# Patient Record
Sex: Female | Born: 1980 | Race: White | Hispanic: Yes | Marital: Single | Smoking: Former smoker
Health system: Southern US, Community
[De-identification: ages and names within clinical notes are randomized; demographics above are authoritative.]

## PROBLEM LIST (undated history)

## (undated) DIAGNOSIS — N210 Calculus in bladder: Secondary | ICD-10-CM

## (undated) DIAGNOSIS — E039 Hypothyroidism, unspecified: Secondary | ICD-10-CM

## (undated) DIAGNOSIS — D649 Anemia, unspecified: Secondary | ICD-10-CM

## (undated) DIAGNOSIS — O24419 Gestational diabetes mellitus in pregnancy, unspecified control: Secondary | ICD-10-CM

## (undated) HISTORY — DX: Gestational diabetes mellitus in pregnancy, unspecified control: O24.419

## (undated) HISTORY — PX: GALLBLADDER SURGERY: SHX652

---

## 2014-03-09 NOTE — L&D Delivery Note (Signed)
Obstetrical Delivery Note   Date of Delivery:   11/01/2014 at 1134 Primary OB:   [facility] Gestational Age/EDD: [redacted]w[redacted]d (Dated by 30 week ultrasound) Antepartum complications: late onset care, UTIs, noncompliant with care, suspected macrosomia  Delivered By:   Farrel Conners, CNM  Delivery Type:   spontaneous vaginal delivery  Procedure Details:   Rapid progress to complete and +1 after AROM of forebag. Anesthesia rebolused epidural just before she was completely dilated. Spontaneous vaginal delivery of vigorous female infant in OA with thick MSAF. Baby dried and placed on maternal abdomen. After delayed cord clamping, cord cut, and baby placed skin to skin. Placenta delivered intact, spontaneously,  followed by brisk bleeding. IV Pitcoin, bimanual and Cytotec 800 mcg PR resolved the heavy bleeding. Anesthesia:    epidural Intrapartum complications: Meconium stained amniotic fluid and variable decelerations in second stage GBS:    negative Laceration:    none Episiotomy:    none Placenta:    Via active 3rd stage. To pathology: no Estimated Blood Loss:  475 ml  Baby:    Liveborn female, Apgars 8/9, weight 8#    Delton Stelle, CNM

## 2014-08-16 ENCOUNTER — Other Ambulatory Visit: Payer: Self-pay | Admitting: Advanced Practice Midwife

## 2014-08-16 DIAGNOSIS — Z3689 Encounter for other specified antenatal screening: Secondary | ICD-10-CM

## 2014-08-17 LAB — OB RESULTS CONSOLE ABO/RH: RH Type: POSITIVE

## 2014-08-17 LAB — OB RESULTS CONSOLE RPR
RPR: NONREACTIVE
RPR: NONREACTIVE
RPR: NONREACTIVE

## 2014-08-17 LAB — OB RESULTS CONSOLE HGB/HCT, BLOOD: HEMOGLOBIN: 10.6 g/dL

## 2014-08-17 LAB — OB RESULTS CONSOLE HIV ANTIBODY (ROUTINE TESTING)
HIV: NONREACTIVE
HIV: NONREACTIVE
HIV: NONREACTIVE

## 2014-08-17 LAB — OB RESULTS CONSOLE PLATELET COUNT: PLATELETS: 167 10*3/uL

## 2014-08-17 LAB — OB RESULTS CONSOLE VARICELLA ZOSTER ANTIBODY, IGG: Varicella: IMMUNE

## 2014-08-17 LAB — OB RESULTS CONSOLE RUBELLA ANTIBODY, IGM: RUBELLA: IMMUNE

## 2014-08-17 LAB — OB RESULTS CONSOLE HEPATITIS B SURFACE ANTIGEN: HEP B S AG: NEGATIVE

## 2014-08-20 ENCOUNTER — Ambulatory Visit
Admission: RE | Admit: 2014-08-20 | Discharge: 2014-08-20 | Disposition: A | Discharge status: Home / Self Care | Payer: Medicaid Other | Source: Ambulatory Visit | Attending: Advanced Practice Midwife | Admitting: Advanced Practice Midwife

## 2014-08-20 DIAGNOSIS — Z3A30 30 weeks gestation of pregnancy: Secondary | ICD-10-CM | POA: Diagnosis not present

## 2014-08-20 DIAGNOSIS — Z36 Encounter for antenatal screening of mother: Secondary | ICD-10-CM | POA: Diagnosis not present

## 2014-08-20 DIAGNOSIS — Z3689 Encounter for other specified antenatal screening: Secondary | ICD-10-CM

## 2014-10-16 LAB — OB RESULTS CONSOLE GC/CHLAMYDIA
Chlamydia: NEGATIVE
GC PROBE AMP, GENITAL: NEGATIVE

## 2014-10-17 LAB — OB RESULTS CONSOLE GBS: STREP GROUP B AG: NEGATIVE

## 2014-10-30 ENCOUNTER — Inpatient Hospital Stay
Admission: EM | Admit: 2014-10-30 | Discharge: 2014-11-02 | DRG: 775 | Disposition: A | Discharge status: Home / Self Care | Payer: Medicaid Other | Attending: Obstetrics & Gynecology | Admitting: Obstetrics & Gynecology

## 2014-10-30 DIAGNOSIS — Z3A41 41 weeks gestation of pregnancy: Secondary | ICD-10-CM | POA: Diagnosis present

## 2014-10-30 DIAGNOSIS — O480 Post-term pregnancy: Secondary | ICD-10-CM | POA: Diagnosis present

## 2014-10-30 DIAGNOSIS — Z87440 Personal history of urinary (tract) infections: Secondary | ICD-10-CM | POA: Diagnosis not present

## 2014-10-30 LAB — CBC
HEMATOCRIT: 35.1 % (ref 35.0–47.0)
HEMOGLOBIN: 11.9 g/dL — AB (ref 12.0–16.0)
MCH: 30.9 pg (ref 26.0–34.0)
MCHC: 34 g/dL (ref 32.0–36.0)
MCV: 91 fL (ref 80.0–100.0)
Platelets: 196 10*3/uL (ref 150–440)
RBC: 3.85 MIL/uL (ref 3.80–5.20)
RDW: 13.7 % (ref 11.5–14.5)
WBC: 13.7 10*3/uL — ABNORMAL HIGH (ref 3.6–11.0)

## 2014-10-30 MED ORDER — FLEET ENEMA 7-19 GM/118ML RE ENEM: 1.0000 | ENEMA | RECTAL | Status: DC | PRN

## 2014-10-30 MED ORDER — ZOLPIDEM TARTRATE 5 MG PO TABS: 5.0000 mg | ORAL_TABLET | Freq: Every evening | ORAL | Status: DC | PRN

## 2014-10-30 MED ORDER — ZOLPIDEM TARTRATE 5 MG PO TABS
ORAL_TABLET | ORAL | Status: AC
  Administered 2014-10-31: 5 mg via ORAL
  Filled 2014-10-30: qty 1

## 2014-10-30 MED ORDER — OXYTOCIN BOLUS FROM INFUSION
500.0000 mL | INTRAVENOUS | Status: DC
  Administered 2014-11-01: 500 mL via INTRAVENOUS

## 2014-10-30 MED ORDER — OXYTOCIN 40 UNITS IN LACTATED RINGERS INFUSION - SIMPLE MED
1.0000 m[IU]/min | INTRAVENOUS | Status: DC
  Administered 2014-10-31: 1 m[IU]/min via INTRAVENOUS
  Administered 2014-10-31: 3 m[IU]/min via INTRAVENOUS
  Filled 2014-10-30: qty 1000

## 2014-10-30 MED ORDER — LACTATED RINGERS IV SOLN
500.0000 mL | INTRAVENOUS | Status: DC | PRN
  Administered 2014-10-31: 1000 mL via INTRAVENOUS

## 2014-10-30 MED ORDER — ACETAMINOPHEN 325 MG PO TABS: 650.0000 mg | ORAL_TABLET | ORAL | Status: DC | PRN

## 2014-10-30 MED ORDER — OXYCODONE-ACETAMINOPHEN 5-325 MG PO TABS: 2.0000 | ORAL_TABLET | ORAL | Status: DC | PRN

## 2014-10-30 MED ORDER — ONDANSETRON HCL 4 MG/2ML IJ SOLN
4.0000 mg | Freq: Four times a day (QID) | INTRAMUSCULAR | Status: DC | PRN
  Administered 2014-10-31 (×2): 4 mg via INTRAVENOUS
  Filled 2014-10-30 (×2): qty 2

## 2014-10-30 MED ORDER — TERBUTALINE SULFATE 1 MG/ML IJ SOLN: 0.2500 mg | Freq: Once | INTRAMUSCULAR | Status: DC | PRN

## 2014-10-30 MED ORDER — OXYCODONE-ACETAMINOPHEN 5-325 MG PO TABS: 1.0000 | ORAL_TABLET | ORAL | Status: DC | PRN

## 2014-10-30 MED ORDER — DINOPROSTONE 10 MG VA INST
10.0000 mg | VAGINAL_INSERT | Freq: Once | VAGINAL | Status: AC
  Administered 2014-10-31: 10 mg via VAGINAL
  Filled 2014-10-30: qty 1

## 2014-10-30 MED ORDER — ZOLPIDEM TARTRATE 5 MG PO TABS
5.0000 mg | ORAL_TABLET | Freq: Every evening | ORAL | Status: DC | PRN
  Administered 2014-10-31: 5 mg via ORAL

## 2014-10-30 MED ORDER — OXYTOCIN 40 UNITS IN LACTATED RINGERS INFUSION - SIMPLE MED
62.5000 mL/h | INTRAVENOUS | Status: DC
  Administered 2014-11-01: 62.5 mL/h via INTRAVENOUS
  Filled 2014-10-30: qty 1000

## 2014-10-30 MED ORDER — CITRIC ACID-SODIUM CITRATE 334-500 MG/5ML PO SOLN: 30.0000 mL | ORAL | Status: DC | PRN

## 2014-10-30 MED ORDER — FENTANYL CITRATE (PF) 100 MCG/2ML IJ SOLN: 50.0000 ug | INTRAMUSCULAR | Status: DC | PRN

## 2014-10-30 MED ORDER — LIDOCAINE HCL (PF) 1 % IJ SOLN
30.0000 mL | INTRAMUSCULAR | Status: DC | PRN
  Filled 2014-10-30: qty 30

## 2014-10-30 MED ORDER — LACTATED RINGERS IV SOLN
INTRAVENOUS | Status: DC
  Administered 2014-10-30 – 2014-10-31 (×2): via INTRAVENOUS

## 2014-10-30 NOTE — H&P (Signed)
Obstetrics Admission History & Physical  10/30/2014 - 11:15 PM Primary OBGYN: [facility]  Chief Complaint: IOL for post dates  History of Present Illness  34 y.o. [id number] @ 40/6 (dated by 30wk u/s), with the above CC. Pregnancy complicated by: h/o UTI and currently on ppx, h/o anemia, h/o macrosomic pregnancies.  [name] states that no labor s/s or decreased FM  Review of Systems:  her 12 point review of systems is negative or as noted in the History of Present Illness.  PMHx: History reviewed. No pertinent past medical history. PSHx: History reviewed. No pertinent past surgical history. Medications:  Prescriptions prior to admission  Medication Sig Dispense Refill Last Dose  . ferrous fumarate (HEMOCYTE - 106 MG FE) 325 (106 FE) MG TABS tablet Take 1 tablet by mouth.     . nitrofurantoin (MACRODANTIN) 100 MG capsule Take 100 mg by mouth 2 (two) times daily.     . Prenatal Vit-Fe Fumarate-FA (PRENATAL MULTIVITAMIN) TABS tablet Take 1 tablet by mouth daily at 12 noon.        Allergies: has No Known Allergies. OBGYNHx: Largest child was 9lbs 7oz. As above FHx: No family history on file. Soc Hx:  Social History   Social History  . Marital Status: Single    Spouse Name: N/A  . Number of Children: N/A  . Years of Education: N/A   Occupational History  . Not on file.   Social History Main Topics  . Smoking status: Not on file  . Smokeless tobacco: Not on file  . Alcohol Use: Not on file  . Drug Use: Not on file  . Sexual Activity: Not on file   Other Topics Concern  . Not on file   Social History Narrative    Objective  VS pending  EFM: 140 baseline, +accels, one late decel seen, mod variability Toco: irregular UCs  General: Well nourished, well developed female in no acute distress.  Skin:  Warm and dry.  Cardiovascular: Regular rate and rhythm. Respiratory:  Clear to auscultation bilateral. Normal respiratory effort Abdomen: soft, obese, nttp,  gravid Neuro/Psych:  Normal mood and affect.   SVE: 1-2/50/-3/soft/midpositon; doesn't tolerate exams well Leopolds/EFW: 8/22 4331gm, >90%, AC 1wk ahead, normal AFI, cephalic  Labs  pending  Radiology Bedside u/s cephalic  Perinatal info  A pos/ Rubella immune / VaricellaImmune/RPR pending/HIV neg/HepB Surf Ag neg/TDaP declined /pap unknown/desires BTL  Assessment & Plan  Pt doing well here for IOL *IUP: follow up vital signs. category I tracing currently *IOL: will place cervidil, given poor tolerance of exams *Macrosomic fetus: keep close eye on labor course as poor dilation and/or fetal descent could be sign of CPD. Extra help in room for delivery, in case shoulder dystocia. Consideration of PP miso as has two risk factors for PPH: potentially large fetus and future grand multip -s/p negative 28wk 1hr GTT *GBS: neg *Analgesia: no needs currently *Contraception: follow up BTL papers and try to obtain them from [facility] as patient desires ppBTL. Obese abdomen and will have to be up to provider if pt is acceptable for ppBTL *h/o UTI: follow up UCx on admission  Cornelia Copa. MD [facility] Pager [phone]

## 2014-10-31 ENCOUNTER — Inpatient Hospital Stay: Payer: Medicaid Other | Admitting: Anesthesiology

## 2014-10-31 LAB — TYPE AND SCREEN
ABO/RH(D): A POS
Antibody Screen: NEGATIVE

## 2014-10-31 LAB — CBC
HEMATOCRIT: 36.4 % (ref 35.0–47.0)
HEMOGLOBIN: 12.3 g/dL (ref 12.0–16.0)
MCH: 30.7 pg (ref 26.0–34.0)
MCHC: 33.8 g/dL (ref 32.0–36.0)
MCV: 90.9 fL (ref 80.0–100.0)
Platelets: 193 10*3/uL (ref 150–440)
RBC: 4.01 MIL/uL (ref 3.80–5.20)
RDW: 13.9 % (ref 11.5–14.5)
WBC: 14 10*3/uL — AB (ref 3.6–11.0)

## 2014-10-31 LAB — ABO/RH: ABO/RH(D): A POS

## 2014-10-31 MED ORDER — FENTANYL 2.5 MCG/ML W/ROPIVACAINE 0.2% IN NS 100 ML EPIDURAL INFUSION (ARMC-ANES)
EPIDURAL | Status: AC
  Administered 2014-10-31: 10 mL/h via EPIDURAL
  Filled 2014-10-31: qty 100

## 2014-10-31 MED ORDER — TERBUTALINE SULFATE 1 MG/ML IJ SOLN
0.2500 mg | Freq: Once | INTRAMUSCULAR | Status: DC | PRN
Start: 2014-10-31 — End: 2014-11-01

## 2014-10-31 MED ORDER — BUTORPHANOL TARTRATE 1 MG/ML IJ SOLN
1.0000 mg | INTRAMUSCULAR | Status: DC | PRN
  Administered 2014-10-31 (×5): 1 mg via INTRAVENOUS
  Filled 2014-10-31 (×4): qty 1

## 2014-10-31 MED ORDER — OXYTOCIN 40 UNITS IN LACTATED RINGERS INFUSION - SIMPLE MED: 1.0000 m[IU]/min | INTRAVENOUS | Status: DC

## 2014-10-31 MED ORDER — BUPIVACAINE HCL (PF) 0.25 % IJ SOLN
INTRAMUSCULAR | Status: DC | PRN
  Administered 2014-10-31: 5 mL via PERINEURAL
  Administered 2014-11-01: 5 mL

## 2014-10-31 MED ORDER — LIDOCAINE HCL (PF) 1 % IJ SOLN
INTRAMUSCULAR | Status: DC | PRN
  Administered 2014-10-31: 5 mL via EPIDURAL

## 2014-10-31 MED ORDER — BUTORPHANOL TARTRATE 1 MG/ML IJ SOLN
INTRAMUSCULAR | Status: AC
  Administered 2014-10-31: 1 mg via INTRAVENOUS
  Filled 2014-10-31: qty 1

## 2014-10-31 NOTE — Anesthesia Procedure Notes (Signed)
Epidural Patient location during procedure: OB Start time: 10/31/2014 10:35 PM End time: 10/31/2014 10:59 PM  Staffing Anesthesiologist: Elijio Miles F  Preanesthetic Checklist Completed: patient identified, site marked, surgical consent, pre-op evaluation, timeout performed, IV checked, risks and benefits discussed, monitors and equipment checked and at surgeon's request  Epidural Patient position: sitting Prep: Betadine Patient monitoring: heart rate and blood pressure Approach: midline Location: L3-L4 Injection technique: LOR air and LOR saline  Needle:  Needle type: Tuohy  Needle gauge: 18 G Needle length: 9 cm Needle insertion depth: 5 cm Catheter type: closed end flexible Catheter size: 20 Guage Catheter at skin depth: 10 cm Test dose: negative and 2% lidocaine with Epi 1:200 K  Assessment Sensory level: T8  Additional Notes Reason for block:at surgeon's request

## 2014-10-31 NOTE — Anesthesia Preprocedure Evaluation (Addendum)
Anesthesia Evaluation  Patient identified by MRN, date of birth, ID band Patient awake    Reviewed: Allergy & Precautions, NPO status , Patient's Chart, lab work & pertinent test results  Airway Mallampati: I       Dental no notable dental hx.    Pulmonary neg pulmonary ROS,    Pulmonary exam normal       Cardiovascular negative cardio ROS Normal cardiovascular exam    Neuro/Psych negative neurological ROS  negative psych ROS   GI/Hepatic negative GI ROS, Neg liver ROS,   Endo/Other  negative endocrine ROS  Renal/GU negative Renal ROS     Musculoskeletal   Abdominal Normal abdominal exam  (+)   Peds negative pediatric ROS (+)  Hematology negative hematology ROS (+)   Anesthesia Other Findings   Reproductive/Obstetrics                             Anesthesia Physical Anesthesia Plan  ASA: II  Anesthesia Plan: Epidural   Post-op Pain Management:    Induction:   Airway Management Planned: Natural Airway  Additional Equipment:   Intra-op Plan:   Post-operative Plan:   Informed Consent: I have reviewed the patients History and Physical, chart, labs and discussed the procedure including the risks, benefits and alternatives for the proposed anesthesia with the patient or authorized representative who has indicated his/her understanding and acceptance.     Plan Discussed with:   Anesthesia Plan Comments:         Anesthesia Quick Evaluation

## 2014-11-01 ENCOUNTER — Encounter: Payer: Self-pay | Admitting: *Deleted

## 2014-11-01 LAB — URINE CULTURE

## 2014-11-01 LAB — RPR: RPR: NONREACTIVE

## 2014-11-01 MED ORDER — LIDOCAINE HCL (PF) 1 % IJ SOLN
INTRAMUSCULAR | Status: AC
  Filled 2014-11-01: qty 30

## 2014-11-01 MED ORDER — WITCH HAZEL-GLYCERIN EX PADS: 1.0000 "application " | MEDICATED_PAD | CUTANEOUS | Status: DC | PRN

## 2014-11-01 MED ORDER — OXYTOCIN 40 UNITS IN LACTATED RINGERS INFUSION - SIMPLE MED: 62.5000 mL/h | INTRAVENOUS | Status: DC | PRN

## 2014-11-01 MED ORDER — ONDANSETRON HCL 4 MG PO TABS
4.0000 mg | ORAL_TABLET | ORAL | Status: DC | PRN
  Administered 2014-11-02: 4 mg via ORAL
  Filled 2014-11-01: qty 1

## 2014-11-01 MED ORDER — OXYCODONE-ACETAMINOPHEN 5-325 MG PO TABS
1.0000 | ORAL_TABLET | ORAL | Status: DC | PRN
  Administered 2014-11-01: 1 via ORAL
  Filled 2014-11-01: qty 1

## 2014-11-01 MED ORDER — NITROFURANTOIN MONOHYD MACRO 100 MG PO CAPS
100.0000 mg | ORAL_CAPSULE | Freq: Two times a day (BID) | ORAL | Status: DC
  Administered 2014-11-01: 100 mg via ORAL
  Filled 2014-11-01: qty 1

## 2014-11-01 MED ORDER — MISOPROSTOL 200 MCG PO TABS
800.0000 ug | ORAL_TABLET | Freq: Once | ORAL | Status: AC | PRN
  Administered 2014-11-01: 800 ug via RECTAL

## 2014-11-01 MED ORDER — OXYCODONE-ACETAMINOPHEN 5-325 MG PO TABS
2.0000 | ORAL_TABLET | ORAL | Status: DC | PRN
  Administered 2014-11-01 – 2014-11-02 (×4): 2 via ORAL
  Filled 2014-11-01 (×4): qty 2

## 2014-11-01 MED ORDER — LACTATED RINGERS IV SOLN
INTRAVENOUS | Status: DC
  Administered 2014-11-01: 22:00:00 via INTRAVENOUS

## 2014-11-01 MED ORDER — BENZOCAINE-MENTHOL 20-0.5 % EX AERO: 1.0000 "application " | INHALATION_SPRAY | CUTANEOUS | Status: DC | PRN

## 2014-11-01 MED ORDER — FENTANYL 2.5 MCG/ML W/ROPIVACAINE 0.2% IN NS 100 ML EPIDURAL INFUSION (ARMC-ANES)
EPIDURAL | Status: AC
  Administered 2014-11-01: 10 mL/h
  Filled 2014-11-01: qty 100

## 2014-11-01 MED ORDER — ONDANSETRON HCL 4 MG/2ML IJ SOLN: 4.0000 mg | INTRAMUSCULAR | Status: DC | PRN

## 2014-11-01 MED ORDER — IBUPROFEN 600 MG PO TABS
600.0000 mg | ORAL_TABLET | Freq: Four times a day (QID) | ORAL | Status: DC
  Administered 2014-11-01 – 2014-11-02 (×5): 600 mg via ORAL
  Filled 2014-11-01 (×5): qty 1

## 2014-11-01 MED ORDER — MISOPROSTOL 200 MCG PO TABS
ORAL_TABLET | ORAL | Status: AC
  Administered 2014-11-01: 800 ug via RECTAL
  Filled 2014-11-01: qty 4

## 2014-11-01 MED ORDER — STERILE WATER FOR INJECTION IJ SOLN
INTRAMUSCULAR | Status: AC
  Filled 2014-11-01: qty 10

## 2014-11-01 MED ORDER — LANOLIN HYDROUS EX OINT: TOPICAL_OINTMENT | CUTANEOUS | Status: DC | PRN

## 2014-11-01 MED ORDER — FERROUS SULFATE 325 (65 FE) MG PO TABS
325.0000 mg | ORAL_TABLET | Freq: Every day | ORAL | Status: DC
  Administered 2014-11-02: 325 mg via ORAL
  Filled 2014-11-01: qty 1

## 2014-11-01 MED ORDER — NITROFURANTOIN MACROCRYSTAL 100 MG PO CAPS
100.0000 mg | ORAL_CAPSULE | Freq: Two times a day (BID) | ORAL | Status: DC
  Filled 2014-11-01 (×3): qty 1

## 2014-11-01 MED ORDER — PRENATAL MULTIVITAMIN CH
1.0000 | ORAL_TABLET | Freq: Every day | ORAL | Status: DC
  Administered 2014-11-02: 1 via ORAL
  Filled 2014-11-01: qty 1

## 2014-11-01 MED ORDER — DIBUCAINE 1 % RE OINT: 1.0000 "application " | TOPICAL_OINTMENT | RECTAL | Status: DC | PRN

## 2014-11-01 MED ORDER — DOCUSATE SODIUM 100 MG PO CAPS
100.0000 mg | ORAL_CAPSULE | Freq: Two times a day (BID) | ORAL | Status: DC
  Administered 2014-11-01 – 2014-11-02 (×2): 100 mg via ORAL
  Filled 2014-11-01 (×2): qty 1

## 2014-11-01 MED ORDER — SIMETHICONE 80 MG PO CHEW: 80.0000 mg | CHEWABLE_TABLET | ORAL | Status: DC | PRN

## 2014-11-01 NOTE — Discharge Summary (Signed)
Physician Obstetric Discharge Summary  Patient ID: Cynthia Mckay MRN: [id number] DOB/AGE: [birth year] 34 y.o.   Date of Admission: 10/30/2014 Date of delivery:11/01/2014 Date of Discharge: 11/02/14  Admitting Diagnosis: Induction of labor at [redacted]w[redacted]d  Secondary Diagnosis: Suspected macrosomia.   Mode of Delivery: normal spontaneous vaginal delivery 10/31/2104       Discharge Diagnosis: Term pregnancy-delivered, Meconium stained amniotic fluid   Intrapartum Procedures: Cervidil, Pitocin induction, Amniotomy of forebag, Intrauterine pressure catheter, Fetal scalp electrode, epidural anesthesia   Post partum procedures: none  Complications: none   Brief Hospital Course  Cynthia Mckay is a [id number] who had a SVD on 11/01/2014;  for further details of this delivery, please refer to the delivery note.  Patient had an uncomplicated postpartum course.  By time of discharge on PPD#1, her pain was controlled on oral pain medications; she had appropriate lochia and was ambulating, voiding without difficulty and tolerating regular diet.  She was deemed stable for discharge to home.    Labs: CBC Latest Ref Rng 11/02/2014 10/31/2014 10/30/2014  WBC 3.6 - 11.0 K/uL 11.9(H) 14.0(H) 13.7(H)  Hemoglobin 12.0 - 16.0 g/dL 10.7(L) 12.3 11.9(L)  Hematocrit 35.0 - 47.0 % 31.4(L) 36.4 35.1  Platelets 150 - 440 K/uL 179 193 196   A POS  Physical exam:  Blood pressure 131/74, pulse 93, temperature 97.9 F (36.6 C), temperature source Oral, resp. rate 20, height  (1.676 m), weight 114.306 kg (252 lb), last menstrual period 03/07/2014, SpO2 99 %, unknown if currently breastfeeding. General: alert and no distress Lochia: appropriate Abdomen: soft, NT Uterine Fundus: firm Extremities: No evidence of DVT seen on physical exam. No lower extremity edema.  Discharge Instructions: Per After Visit Summary. Activity: Advance as tolerated. Pelvic rest for 6 weeks.  Also refer to Discharge Instructions Diet:  Regular Medications:   Medication List    STOP taking these medications        nitrofurantoin 100 MG capsule  Commonly known as:  MACRODANTIN      TAKE these medications        ferrous fumarate 325 (106 FE) MG Tabs tablet  Commonly known as:  HEMOCYTE - 106 mg FE  Take 1 tablet by mouth.     ibuprofen 600 MG tablet  Commonly known as:  ADVIL,MOTRIN  Take 1 tablet (600 mg total) by mouth every 6 (six) hours.     prenatal multivitamin Tabs tablet  Take 1 tablet by mouth daily at 12 noon.       Outpatient follow up:  Follow-up Information    Follow up with Ward, Elenora Fender, MD In 4 weeks.   Specialty:  Obstetrics and Gynecology   Why:  For Post Partum Check and to schedule/plan Lap BTL   Contact information:   [address] [place] [address] [phone]       Follow up with [facility] In 6 weeks.   Why:  For Post Partum Check   Contact information:   [address] [place] [address] [phone]      Postpartum contraception: bilateral tubal ligation- plans after 4-6 weeks (did not want while in hospital now)  Discharged Condition: good  Discharged to: home   Newborn Data: Disposition:home with mother  Apgars: APGAR (1 MIN): 8   APGAR (5 MINS): 9   Baby Feeding: breast and bottle/ 8#/ female-Cynthia  Letitia Libra, MD 11/02/2014 7:57 AM

## 2014-11-01 NOTE — Anesthesia Postprocedure Evaluation (Deleted)
  Anesthesia Post-op Note  Patient: Cynthia Mckay  Procedure(s) Performed: CLE Anesthesia type:Epidural  Patient location: LDR 4  Post pain: Pain level controlled  Post assessment: Post-op Vital signs reviewed, Patient's Cardiovascular Status Stable, Respiratory Function Stable, Patent Airway and No signs of Nausea or vomiting  Post vital signs: Reviewed and stable  Last Vitals:  Filed Vitals:   11/01/14 0759  BP:   Pulse:   Temp:   Resp: 18    Level of consciousness: awake, alert  and patient cooperative  Complications: No apparent anesthesia complications

## 2014-11-01 NOTE — Progress Notes (Signed)
L&D Note  11/01/2014 - 8:31 AM  34 y.o. [id number] [redacted]w[redacted]d by a 30wk5d ultrasound (EDC 10/24/2014)  Pregnancy complicated by late entry to care, UTIs, noncompliance with prenatal care, third trimester ultrasound dating pregnancy and suspected macrosomia.  Patient Active Problem List   Diagnosis Date Noted  . Labor and delivery, indication for care 10/30/2014  . Macrosomia 10/30/2014    [name] was admitted for IOL for suspected macrosomia and postdates (41 weeks).   Subjective:  Epidural working well. SROM at 0030 this AM.  Objective:   Filed Vitals:   11/01/14 0432 11/01/14 0539 11/01/14 0639 11/01/14 0759  BP: 115/84 133/65 121/68   Pulse: 104 98 88   Temp: 98 F (36.7 C)     TempSrc: Oral     Resp: 16   18  Height:      Weight:      SpO2:        Current Vital Signs 24h Vital Sign Ranges  T 98 F (36.7 C) Temp  Avg: 98.1 F (36.7 C)  Min: 97.8 F (36.6 C)  Max: 98.4 F (36.9 C)  BP 121/68 mmHg BP  Min: 94/51  Max: 142/72  HR 88 Pulse  Avg: 92.4  Min: 84  Max: 104  RR 18 Resp  Avg: 16.6  Min: 16  Max: 18  SaO2 99 % Not Delivered SpO2  Avg: 99.5 %  Min: 99 %  Max: 100 %       24 Hour I/O Current Shift I/O  Time Ins Outs 08/24 0701 - 08/25 0700 In: -  Out: 575 [Urine:575]     FHR:CAt 1 with baseline 125 and beautiful accelerations to 160s Toco: difficulty picking up with external monitor. AROM forebag for moderate MSAF. IUPC inserted. Contractions q2 min apart on 20 miu/min Pitocin. Pitocin decreased to 10 miu/min YNW:GNFAOZH comfortable SVE:5/75%/-2 and anterior  Labs:   Recent Labs Lab 10/30/14 2139 10/31/14 2107  WBC 13.7* 14.0*  HGB 11.9* 12.3  HCT 35.1 36.4  PLT 196 193   Medications Current Facility-Administered Medications  Medication Dose Route Frequency Provider Last Rate Last Dose  . acetaminophen (TYLENOL) tablet 650 mg  650 mg Oral Q4H PRN [place] Bing, MD      . butorphanol (STADOL) injection 1 mg  1 mg Intravenous Q1H PRN  [place] Bing, MD   1 mg at 10/31/14 2146  . citric acid-sodium citrate (ORACIT) solution 30 mL  30 mL Oral Q2H PRN [place] Bing, MD      . fentaNYL (SUBLIMAZE) injection 50 mcg  50 mcg Intravenous Q1H PRN [place] Bing, MD      . lactated ringers infusion 500-1,000 mL  500-1,000 mL Intravenous PRN [place] Bing, MD 1,000 mL/hr at 10/31/14 2055 1,000 mL at 10/31/14 2055  . lactated ringers infusion   Intravenous Continuous [place] Bing, MD 125 mL/hr at 10/31/14 1257    . lidocaine (PF) (XYLOCAINE) 1 % injection 30 mL  30 mL Subcutaneous PRN [place] Bing, MD      . lidocaine (PF) (XYLOCAINE) 1 % injection           . misoprostol (CYTOTEC) 200 MCG tablet           . ondansetron (ZOFRAN) injection 4 mg  4 mg Intravenous Q6H PRN [place] Bing, MD   4 mg at 10/31/14 1940  . oxytocin (PITOCIN) IV BOLUS FROM BAG  500 mL Intravenous Continuous  Bing, MD      . oxytocin (PITOCIN) IV infusion 40  units in LR 1000 mL  62.5 mL/hr Intravenous Continuous [place] Bing, MD      . oxytocin (PITOCIN) IV infusion 40 units in LR 1000 mL  1-40 milli-units/min Intravenous Titrated [place] Bing, MD 15 mL/hr at 11/01/14 0808 10 milli-units/min at 11/01/14 0808  . terbutaline (BRETHINE) injection 0.25 mg  0.25 mg Subcutaneous Once PRN [place] Bing, MD      . terbutaline (BRETHINE) injection 0.25 mg  0.25 mg Subcutaneous Once PRN Elenora Fender Ward, MD      . zolpidem (AMBIEN) tablet 5 mg  5 mg Oral QHS PRN Faith Bing, MD   5 mg at 10/31/14 0001   Facility-Administered Medications Ordered in Other Encounters  Medication Dose Route Frequency Provider Last Rate Last Dose  . bupivacaine (PF) (MARCAINE) 0.25 % injection    Anesthesia Intra-op Gijsbertus Georgana Curio, MD   5 mL at 10/31/14 2246  . lidocaine (PF) (XYLOCAINE) 1 % injection    Anesthesia Intra-op Gijsbertus Georgana Curio, MD   5 mL at 10/31/14 2246    Assessment & Plan:   A/P: IUP at 41.1 weeks with suspected macrosomia  undergoing IOL with Pitocin. Has been 5 cm since about 0530.   IUPC inserted-will titrate Pitocin to get 200 mvus  Monitor progress closely now that forebag ruptured  FWB: MSAF but with Cat 1 strip, will have neonatal team at delivery for deep suctioning if needed  Good pain relief with epidural  Cath urine culture due to hx of UTIs this pregnancy  Farrel Conners, CNM     [employer] Pager [phone]

## 2014-11-02 ENCOUNTER — Encounter
Admission: EM | Disposition: A | Discharge status: Home / Self Care | Payer: Self-pay | Source: Home / Self Care | Attending: Obstetrics & Gynecology

## 2014-11-02 LAB — CBC
HCT: 31.4 % — ABNORMAL LOW (ref 35.0–47.0)
Hemoglobin: 10.7 g/dL — ABNORMAL LOW (ref 12.0–16.0)
MCH: 31.1 pg (ref 26.0–34.0)
MCHC: 34.1 g/dL (ref 32.0–36.0)
MCV: 91.3 fL (ref 80.0–100.0)
PLATELETS: 179 10*3/uL (ref 150–440)
RBC: 3.44 MIL/uL — ABNORMAL LOW (ref 3.80–5.20)
RDW: 13.7 % (ref 11.5–14.5)
WBC: 11.9 10*3/uL — AB (ref 3.6–11.0)

## 2014-11-02 SURGERY — LIGATION, FALLOPIAN TUBE, POSTPARTUM
Anesthesia: General

## 2014-11-02 MED ORDER — IBUPROFEN 600 MG PO TABS: 600.0000 mg | ORAL_TABLET | Freq: Four times a day (QID) | ORAL | Status: DC

## 2014-11-02 NOTE — Discharge Instructions (Signed)
Postpartum Care After Vaginal Delivery °After you deliver your newborn (postpartum period), the usual stay in the hospital is 24-72 hours. If there were problems with your labor or delivery, or if you have other medical problems, you might be in the hospital longer.  °While you are in the hospital, you will receive help and instructions on how to care for yourself and your newborn during the postpartum period.  °While you are in the hospital: °· Be sure to tell your nurses if you have pain or discomfort, as well as where you feel the pain and what makes the pain worse. °· If you had an incision made near your vagina (episiotomy) or if you had some tearing during delivery, the nurses may put ice packs on your episiotomy or tear. The ice packs may help to reduce the pain and swelling. °· If you are breastfeeding, you may feel uncomfortable contractions of your uterus for a couple of weeks. This is normal. The contractions help your uterus get back to normal size. °· It is normal to have some bleeding after delivery. °¨ For the first 1-3 days after delivery, the flow is red and the amount may be similar to a period. °¨ It is common for the flow to start and stop. °¨ In the first few days, you may pass some small clots. Let your nurses know if you begin to pass large clots or your flow increases. °¨ Do not  flush blood clots down the toilet before having the nurse look at them. °¨ During the next 3-10 days after delivery, your flow should become more watery and pink or brown-tinged in color. °¨ Ten to fourteen days after delivery, your flow should be a small amount of yellowish-white discharge. °¨ The amount of your flow will decrease over the first few weeks after delivery. Your flow may stop in 6-8 weeks. Most women have had their flow stop by 12 weeks after delivery. °· You should change your sanitary pads frequently. °· Wash your hands thoroughly with soap and water for at least 20 seconds after changing pads, using  the toilet, or before holding or feeding your newborn. °· You should feel like you need to empty your bladder within the first 6-8 hours after delivery. °· In case you become weak, lightheaded, or faint, call your nurse before you get out of bed for the first time and before you take a shower for the first time. °· Within the first few days after delivery, your breasts may begin to feel tender and full. This is called engorgement. Breast tenderness usually goes away within 48-72 hours after engorgement occurs. You may also notice milk leaking from your breasts. If you are not breastfeeding, do not stimulate your breasts. Breast stimulation can make your breasts produce more milk. °· Spending as much time as possible with your newborn is very important. During this time, you and your newborn can feel close and get to know each other. Having your newborn stay in your room (rooming in) will help to strengthen the bond with your newborn.  It will give you time to get to know your newborn and become comfortable caring for your newborn. °· Your hormones change after delivery. Sometimes the hormone changes can temporarily cause you to feel sad or tearful. These feelings should not last more than a few days. If these feelings last longer than that, you should talk to your caregiver. °· If desired, talk to your caregiver about methods of family planning or contraception. °·   Talk to your caregiver about immunizations. Your caregiver may want you to have the following immunizations before leaving the hospital: °¨ Tetanus, diphtheria, and pertussis (Tdap) or tetanus and diphtheria (Td) immunization. It is very important that you and your family (including grandparents) or others caring for your newborn are up-to-date with the Tdap or Td immunizations. The Tdap or Td immunization can help protect your newborn from getting ill. °¨ Rubella immunization. °¨ Varicella (chickenpox) immunization. °¨ Influenza immunization. You should  receive this annual immunization if you did not receive the immunization during your pregnancy. °Document Released: 12/21/2006 Document Revised: 11/18/2011 Document Reviewed: 10/21/2011 °Exit[employer]® Patient Information ©2015 [employer]. This information is not intended to replace advice given to you by your health care provider. Make sure you discuss any questions you have with your health care provider. ° °Please call your doctor or return to the ER if you experience any chest pains, shortness of breath, fever greater than 101, any heavy bleeding or large clots, and foul smelling vaginal discharge, any worsening abdominal pain & cramping that is not controlled by pain medication, or any signs of post partum depression.  No tampons, enemas, douches, or sexual intercourse for 6 weeks.  Also avoid tub baths, hot tubs, or swimming for 6 weeks. ° °

## 2014-11-02 NOTE — Anesthesia Postprocedure Evaluation (Signed)
  Anesthesia Post-op Note  Patient: Cynthia Mckay  Procedure(s) Performed: CLE  Anesthesia type:Epidural  Patient location: 346  Post pain: Pain level controlled  Post assessment: Post-op Vital signs reviewed, Patient's Cardiovascular Status Stable, Respiratory Function Stable, Patent Airway and No signs of Nausea or vomiting  Post vital signs: Reviewed and stable  Last Vitals:  Filed Vitals:   11/02/14 0738  BP: 131/74  Pulse: 93  Temp: 36.6 C  Resp: 20    Level of consciousness: awake, alert  and patient cooperative  Complications: No apparent anesthesia complications

## 2014-11-02 NOTE — Progress Notes (Signed)
Pt discharged at this time via wheelchair escorted by RN; pt going home with new baby

## 2014-11-02 NOTE — Progress Notes (Signed)
Admit Date: 10/30/2014 Today's Date: 11/02/2014  Post Partum Day 1  Subjective:  no complaints, up ad lib, voiding and tolerating PO  Objective: Temp:  [97.8 F (36.6 C)-98.4 F (36.9 C)] 97.9 F (36.6 C) (08/26 0738) Pulse Rate:  [80-139] 93 (08/26 0738) Resp:  [17-20] 20 (08/26 0738) BP: (101-157)/(46-97) 131/74 mmHg (08/26 0738) SpO2:  [98 %-99 %] 99 % (08/26 0738)  Physical Exam:  General: alert, cooperative and no distress Lochia: appropriate Uterine Fundus: firm Incision: none DVT Evaluation: No evidence of DVT seen on physical exam.   Recent Labs  10/31/14 2107 11/02/14 0708  HGB 12.3 10.7*  HCT 36.4 31.4*    Assessment/Plan: Breastfeeding, Contraception --- is plannin BTL but does not want today, Bottle Feeding and Infant doing well   LOS: 3 days   Evalin Shawhan [facility] 11/02/2014, 8:00 AM

## 2015-07-13 ENCOUNTER — Emergency Department
Admission: EM | Admit: 2015-07-13 | Discharge: 2015-07-13 | Disposition: A | Discharge status: Home / Self Care | Payer: Medicaid Other | Attending: Emergency Medicine | Admitting: Emergency Medicine

## 2015-07-13 ENCOUNTER — Encounter: Payer: Self-pay | Admitting: Emergency Medicine

## 2015-07-13 DIAGNOSIS — K029 Dental caries, unspecified: Secondary | ICD-10-CM | POA: Diagnosis not present

## 2015-07-13 DIAGNOSIS — K047 Periapical abscess without sinus: Secondary | ICD-10-CM

## 2015-07-13 DIAGNOSIS — K0381 Cracked tooth: Secondary | ICD-10-CM

## 2015-07-13 DIAGNOSIS — K0889 Other specified disorders of teeth and supporting structures: Secondary | ICD-10-CM | POA: Diagnosis present

## 2015-07-13 MED ORDER — ACETAMINOPHEN-CODEINE #3 300-30 MG PO TABS
1.0000 | ORAL_TABLET | Freq: Once | ORAL | Status: AC
  Administered 2015-07-13: 1 via ORAL

## 2015-07-13 MED ORDER — ACETAMINOPHEN-CODEINE #3 300-30 MG PO TABS
ORAL_TABLET | ORAL | Status: AC
  Administered 2015-07-13: 1 via ORAL
  Filled 2015-07-13: qty 1

## 2015-07-13 MED ORDER — ACETAMINOPHEN-CODEINE #3 300-30 MG PO TABS: 1.0000 | ORAL_TABLET | Freq: Three times a day (TID) | ORAL | Status: DC | PRN

## 2015-07-13 MED ORDER — PENICILLIN V POTASSIUM 500 MG PO TABS: 500.0000 mg | ORAL_TABLET | Freq: Four times a day (QID) | ORAL | Status: DC

## 2015-07-13 MED ORDER — TRAMADOL HCL 50 MG PO TABS: 50.0000 mg | ORAL_TABLET | Freq: Two times a day (BID) | ORAL | Status: DC

## 2015-07-13 MED ORDER — PENICILLIN V POTASSIUM 500 MG PO TABS
500.0000 mg | ORAL_TABLET | Freq: Once | ORAL | Status: AC
  Administered 2015-07-13: 500 mg via ORAL
  Filled 2015-07-13: qty 1

## 2015-07-13 MED ORDER — TRAMADOL HCL 50 MG PO TABS
50.0000 mg | ORAL_TABLET | Freq: Once | ORAL | Status: DC
  Filled 2015-07-13: qty 1

## 2015-07-13 NOTE — ED Provider Notes (Signed)
[facility] Emergency Department Provider Note ____________________________________________  Time seen: 1540  I have reviewed the triage vital signs and the nursing notes.  HISTORY  Chief Complaint  Dental Pain  HPI Cynthia Mckay is a 35 y.o. female presents to the ED for evaluation of pain to the right upper and lower jaws last 3 days. 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 She describes over the last 3 days she's had increased pain to the jaw and maxilla. She denies any fevers, chills, sweats.She rates her pain at a 9/10 in triage. She'll utilize Tylenol and salt water gargles with limited relief.  History reviewed. No pertinent past medical history.  Patient Active Problem List   Diagnosis Date Noted  . Labor and delivery, indication for care 10/30/2014  . 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 Gallbladder surgery      Current Outpatient Rx  Name  Route  Sig  Dispense  Refill  . ferrous fumarate (HEMOCYTE - 106 MG FE) 325 (106 FE) MG TABS tablet   Oral   Take 1 tablet by mouth.         Marland Kitchen. ibuprofen (ADVIL,MOTRIN) 600 MG tablet   Oral   Take 1 tablet (600 mg total) by mouth every 6 (six) hours.   50 tablet   0   . penicillin v potassium (VEETID) 500 MG tablet   Oral   Take 1 tablet (500 mg total) by mouth 4 (four) times daily.   40 tablet   0   . Prenatal Vit-Fe Fumarate-FA (PRENATAL MULTIVITAMIN) TABS tablet   Oral   Take 1 tablet by mouth daily at 12 noon.         . traMADol (ULTRAM) 50 MG tablet   Oral   Take 1 tablet (50 mg total) by mouth 2 (two) times daily.   10 tablet   0    Allergies Tramadol  History reviewed. No pertinent family history.  Social History Social History  Substance Use Topics  . Smoking status: Never Smoker   . Smokeless tobacco: None  . Alcohol Use: No    Review of  Systems  Constitutional: Negative for fever. Eyes: Negative for visual changes. ENT: Negative for sore throat. Musculoskeletal: Negative for back pain. Skin: Negative for rash. Neurological: Negative for headaches, focal weakness or numbness. ____________________________________________  PHYSICAL EXAM:  VITAL SIGNS: ED Triage Vitals  Enc Vitals Group     BP 07/13/15 1446 125/76 mmHg     Pulse Rate 07/13/15 1446 90     Resp 07/13/15 1446 18     Temp 07/13/15 1446 98.1 F (36.7 C)     Temp Source 07/13/15 1446 Oral     SpO2 07/13/15 1446 97 %     Weight 07/13/15 1444 220 lb (99.791 kg)     Height 07/13/15 1444 5\' 6"  (1.676 m)     Head Cir --      Peak Flow --      Pain Score 07/13/15 1445 9     Pain Loc --      Pain Edu? --      Excl. in GC? --    Constitutional: Alert and oriented. Well appearing and in no distress. Head: Normocephalic and atraumatic. Eyes: Conjunctivae are normal. PERRL. Normal extraocular movements Nose: No congestion/rhinorrhea. Mouth/Throat: Mucous membranes are moist. Uvula is midline. Tonsils are flat. Patient is  noted to have a chronically fractured third molar to the upper and lower jaws on the right. It appears to be local pus collection superficially in the gum on the lower jaw. Neck: Supple. No thyromegaly. Respiratory: Normal respiratory effort.  ___________________________  PROCEDURES  Pen VK 500 mg PO ____________________________________________  INITIAL IMPRESSION / ASSESSMENT AND PLAN / ED COURSE  Acute dental pain due to dental abscess with underlying chronic caries and broken molars. She is discharged with prescription for pen VK and Ultram No. 10 to dose as needed for pain. She is also provided with a list of local dental clinics for definitive treatment. She should dose over-the-counter Tylenol or Motrin as needed for additional pain relief.  ----------------------------------------- 4:12 PM on  07/13/2015 -----------------------------------------  Patient notified RN of allergy to Tramadol at disposition/discharge. Prescription changed to Tylenol #3 (#10). Patient denied any drug allergies to me during the exam.  ____________________________________________  FINAL CLINICAL IMPRESSION(S) / ED DIAGNOSES  Final diagnoses:  Pain due to dental caries  Dental abscess  Broken or cracked tooth, nontraumatic      Lissa Hoard, PA-C 07/13/15 1613  Rockne Menghini, MD 07/13/15 2053

## 2015-07-13 NOTE — Discharge Instructions (Signed)
Dental Abscess A dental abscess is pus in or around a tooth. HOME CARE  Take medicines only as told by your dentist.  If you were prescribed antibiotic medicine, finish all of it even if you start to feel better.  Rinse your mouth (gargle) often with salt water.  Do not drive or use heavy machinery, like a lawn mower, while taking pain medicine.  Do not apply heat to the outside of your mouth.  Keep all follow-up visits as told by your dentist. This is important. GET HELP IF:  Your pain is worse, and medicine does not help. GET HELP RIGHT AWAY IF:  You have a fever or chills.  Your symptoms suddenly get worse.  You have a very bad headache.  You have problems breathing or swallowing.  You have trouble opening your mouth.  You have puffiness (swelling) in your neck or around your eye.   This information is not intended to replace advice given to you by your health care provider. Make sure you discuss any questions you have with your health care provider.   Document Released: 07/10/2014 Document Reviewed: 07/10/2014 Elsevier Interactive Patient Education 2016 Elsevier Inc.  Dental Caries Dental caries is tooth decay. This decay can cause a hole in teeth (cavity) that can get bigger and deeper over time. HOME CARE  Brush and floss your teeth. Do this at least two times a day.  Use a fluoride toothpaste.  Use a mouth rinse if told by your dentist or doctor.  Eat less sugary and starchy foods. Drink less sugary drinks.  Avoid snacking often on sugary and starchy foods. Avoid sipping often on sugary drinks.  Keep regular checkups and cleanings with your dentist.  Use fluoride supplements if told by your dentist or doctor.  Allow fluoride to be applied to teeth if told by your dentist or doctor.   This information is not intended to replace advice given to you by your health care provider. Make sure you discuss any questions you have with your health care  provider.   Document Released: 12/03/2007 Document Revised: 03/16/2014 Document Reviewed: 02/26/2012 Elsevier Interactive Patient Education 2016 Elsevier Inc.  Dental Pain Dental pain may be caused by many things, including:  Tooth decay (cavities or caries). Cavities expose the nerve of your tooth to air and hot or cold temperatures. This can cause pain or discomfort.  Abscess or infection. A dental abscess is a collection of infected pus from a bacterial infection in the inner part of the tooth (pulp). It usually occurs at the end of the tooth's root.  Injury.  An unknown reason (idiopathic). Your pain may be mild or severe. It may only occur when:  You are chewing.  You are exposed to hot or cold temperature.  You are eating or drinking sugary foods or beverages, such as soda or candy. Your pain may also be constant. HOME CARE INSTRUCTIONS Watch your dental pain for any changes. The following actions may help to lessen any discomfort that you are feeling:  Take medicines only as directed by your dentist.  If you were prescribed an antibiotic medicine, finish all of it even if you start to feel better.  Keep all follow-up visits as directed by your dentist. This is important.  Do not apply heat to the outside of your face.  Rinse your mouth or gargle with salt water if directed by your dentist. This helps with pain and swelling.  You can make salt water by adding  tsp  of salt to 1 cup of warm water.  Apply ice to the painful area of your face:  Put ice in a plastic bag.  Place a towel between your skin and the bag.  Leave the ice on for 20 minutes, 2-3 times per day.  Avoid foods or drinks that cause you pain, such as:  Very hot or very cold foods or drinks.  Sweet or sugary foods or drinks. SEEK MEDICAL CARE IF:  Your pain is not controlled with medicines.  Your symptoms are worse.  You have new symptoms. SEEK IMMEDIATE MEDICAL CARE IF:  You are unable  to open your mouth.  You are having trouble breathing or swallowing.  You have a fever.  Your face, neck, or jaw is swollen.   This information is not intended to replace advice given to you by your health care provider. Make sure you discuss any questions you have with your health care provider.   Document Released: 02/23/2005 Document Revised: 07/10/2014 Document Reviewed: 02/19/2014 Elsevier Interactive Patient Education 2016 [employer]  Take the prescription meds as directed. Follow-up with one of the dental clinics listed below.   OPTIONS FOR DENTAL FOLLOW UP CARE  [place] Department of Health and Human Services - Local Safety Net Dental Clinics TripDoors.comhttp://www.ncdhhs.gov/dph/oralhealth/services/safetynetclinics.htm   [facility] [phone])  [facility] [phone])  [facility] [phone]  [facility] [phone])  [facility] [phone]) This clinic caters to the indigent population and is on a lottery system. Location: [employer], [address], [place] [facility] Hours: Wednesdays from 6pm - 9pm, patients seen by a lottery system. For dates, call or go to [email or web address] Services: Cleanings, fillings and simple extractions. Payment Options: DENTAL WORK IS FREE OF CHARGE. Bring proof of income or support. Best way to get seen: Arrive at 5:15 pm - this is a lottery, NOT first come/first serve, so arriving earlier will not increase your chances of being seen.     [facility] [phone] Select option 1 for emergencies   Location: [facility], [address], [place] [facility] Hours: No walk-ins accepted - call the day before to schedule an appointment. Check in times are 9:30 am and 1:30 pm. Services: Simple extractions, temporary fillings, pulpectomy/pulp debridement,  uncomplicated abscess drainage. Payment Options: PAYMENT IS DUE AT THE TIME OF SERVICE.  Fee is usually $100-200, additional surgical procedures (e.g. abscess drainage) may be extra. Cash, checks, Visa/MasterCard accepted.  Can file Medicaid if patient is covered for dental - patient should call case worker to check. No discount for [facility] patients. Best way to get seen: MUST call the day before and get onto the schedule. Can usually be seen the next 1-2 days. No walk-ins accepted.     [facility] [phone]   Location: [facility], [address], [place] [facility] Hours: M, W, Th, F 8am or 1:30pm, Tues 9a or 1:30 - first come/first served. Services: Simple extractions, temporary fillings, uncomplicated abscess drainage.  You do not need to be an [facility] [place] resident. Payment Options: PAYMENT IS DUE AT THE TIME OF SERVICE. Dental insurance, otherwise sliding scale - bring proof of income or support. Depending on income and treatment needed, cost is usually $50-200. Best way to get seen: Arrive early as it is first come/first served.     [facility] [phone]   Location: [phone]  [address] [facility] Hours: Mon-Thu 8a-5p Services: Most basic dental services including extractions and fillings. Payment Options: PAYMENT IS DUE AT THE TIME OF SERVICE. Sliding scale, up to 50% off - bring proof if income or support. Medicaid with dental option accepted. Best way to get seen: Call to schedule an appointment, can usually be seen within 2 weeks OR they will try to see walk-ins - show up at 8a or 2p (you may have to wait).     [facility] [phone] [place] RESIDENTS ONLY   Location: [facility], [address], [place], [place] [address] Clinic Hours: By appointment only. Monday - Thursday 8am-5pm, Friday 8am-12pm Services: Cleanings, fillings,  extractions. Payment Options: PAYMENT IS DUE AT THE TIME OF SERVICE. Cash, Visa or MasterCard. Sliding scale - $30 minimum per service. Best way to get seen: Come in to office, complete packet and make an appointment - need proof of income or support monies for each household member and proof of [facility] residence. Usually takes about a month to get in.     [facility] [phone]   Location: [address] [facility] Hours: Walk-in Urgent Care Dental Services are offered Monday-Friday mornings only. The numbers of emergencies accepted daily is limited to the number of providers available. Maximum 15 - Mondays, Wednesdays & Thursdays Maximum 10 - Tuesdays & Fridays Services: You do not need to be a [facility] resident to be seen for a dental emergency. Emergencies are defined as pain, swelling, abnormal bleeding, or dental trauma. Walkins will receive x-rays if needed. NOTE: Dental cleaning is not an emergency. Payment Options: PAYMENT IS DUE AT THE TIME OF SERVICE. Minimum co-pay is $40.00 for uninsured patients. Minimum co-pay is $3.00 for Medicaid with dental coverage. Dental Insurance is accepted and must be presented at time of visit. Medicare does not cover dental. Forms of payment: Cash, credit card, checks. Best way to get seen: If not previously registered with the clinic, walk-in dental registration begins at 7:15 am and is on a first come/first serve basis. If previously registered with the clinic, call to make an appointment.     [facility] [phone] [place] RESIDENTS ONLY   Location: [address], [place], [place] Clinic Hours: Mon-Thu 10a-2p Services: Extractions only! Payment Options: FREE (donations accepted) - bring proof of income or support Best way to get seen: Call and schedule an appointment OR come at 8am on the 1st Monday of every month (except for holidays) when it is first  come/first served.     [facility] [phone]   Location: [address] [employer], [place] Clinic Hours: Friday mornings Services, Payment Options, Best way to get seen: Call for info

## 2015-07-13 NOTE — ED Notes (Signed)
Pt complains of toothache on upper right side for 3 days.

## 2015-07-13 NOTE — ED Notes (Signed)
Patient verbalized understanding that she should not drive home because she was given narcotic pain medication in the ED.  Patient verbalized understanding of need to follow-up with pcp and dentist.

## 2015-09-19 ENCOUNTER — Emergency Department
Admission: EM | Admit: 2015-09-19 | Discharge: 2015-09-19 | Disposition: A | Discharge status: Home / Self Care | Payer: Medicaid Other | Attending: Emergency Medicine | Admitting: Emergency Medicine

## 2015-09-19 ENCOUNTER — Emergency Department: Payer: Medicaid Other

## 2015-09-19 ENCOUNTER — Encounter: Payer: Self-pay | Admitting: Emergency Medicine

## 2015-09-19 DIAGNOSIS — Z792 Long term (current) use of antibiotics: Secondary | ICD-10-CM | POA: Diagnosis not present

## 2015-09-19 DIAGNOSIS — F17210 Nicotine dependence, cigarettes, uncomplicated: Secondary | ICD-10-CM | POA: Diagnosis not present

## 2015-09-19 DIAGNOSIS — R0789 Other chest pain: Secondary | ICD-10-CM | POA: Diagnosis not present

## 2015-09-19 DIAGNOSIS — Z79899 Other long term (current) drug therapy: Secondary | ICD-10-CM | POA: Insufficient documentation

## 2015-09-19 DIAGNOSIS — R079 Chest pain, unspecified: Secondary | ICD-10-CM

## 2015-09-19 HISTORY — DX: Anemia, unspecified: D64.9

## 2015-09-19 LAB — BASIC METABOLIC PANEL
Anion gap: 6 (ref 5–15)
BUN: 11 mg/dL (ref 6–20)
CHLORIDE: 106 mmol/L (ref 101–111)
CO2: 24 mmol/L (ref 22–32)
CREATININE: 0.4 mg/dL — AB (ref 0.44–1.00)
Calcium: 8.9 mg/dL (ref 8.9–10.3)
GFR calc Af Amer: >60 mL/min (ref >60)
GLUCOSE: 95 mg/dL (ref 65–99)
POTASSIUM: 3.7 mmol/L (ref 3.5–5.1)
SODIUM: 136 mmol/L (ref 135–145)

## 2015-09-19 LAB — CBC
HEMATOCRIT: 37.9 % (ref 35.0–47.0)
Hemoglobin: 13.2 g/dL (ref 12.0–16.0)
MCH: 31.5 pg (ref 26.0–34.0)
MCHC: 34.8 g/dL (ref 32.0–36.0)
MCV: 90.5 fL (ref 80.0–100.0)
PLATELETS: 173 10*3/uL (ref 150–440)
RBC: 4.19 MIL/uL (ref 3.80–5.20)
RDW: 13.1 % (ref 11.5–14.5)
WBC: 9.5 10*3/uL (ref 3.6–11.0)

## 2015-09-19 LAB — POCT PREGNANCY, URINE: Preg Test, Ur: NEGATIVE

## 2015-09-19 LAB — TROPONIN I: Troponin I: <0.03 ng/mL (ref <0.03)

## 2015-09-19 MED ORDER — HYDROCODONE-ACETAMINOPHEN 5-325 MG PO TABS: 1.0000 | ORAL_TABLET | Freq: Four times a day (QID) | ORAL | Status: DC | PRN

## 2015-09-19 MED ORDER — IBUPROFEN 400 MG PO TABS: 400.0000 mg | ORAL_TABLET | Freq: Four times a day (QID) | ORAL | Status: DC | PRN

## 2015-09-19 NOTE — ED Notes (Signed)
States feeling chest pain on the left side. States under a lot of stress from her brother being in a coma. Also states previous chest pain and dx with anxiety but denies being anxious

## 2015-09-19 NOTE — Discharge Instructions (Signed)
Chest Wall Pain Chest wall pain is pain in or around the bones and muscles of your chest. Sometimes, an injury causes this pain. Sometimes, the cause may not be known. This pain may take several weeks or longer to get better. HOME CARE Pay attention to any changes in your symptoms. Take these actions to help with your pain:  Rest as told by your doctor.  Avoid activities that cause pain. Try not to use your chest, belly (abdominal), or side muscles to lift heavy things.  If directed, apply ice to the painful area:  Put ice in a plastic bag.  Place a towel between your skin and the bag.  Leave the ice on for 20 minutes, 2-3 times per day.  Take over-the-counter and prescription medicines only as told by your doctor.  Do not use tobacco products, including cigarettes, chewing tobacco, and e-cigarettes. If you need help quitting, ask your doctor.  Keep all follow-up visits as told by your doctor. This is important. GET HELP IF:  You have a fever.  Your chest pain gets worse.  You have new symptoms. GET HELP RIGHT AWAY IF:  You feel sick to your stomach (nauseous) or you throw up (vomit).  You feel sweaty or light-headed.  You have a cough with phlegm (sputum) or you cough up blood.  You are short of breath.   This information is not intended to replace advice given to you by your health care provider. Make sure you discuss any questions you have with your health care provider.   Document Released: 08/12/2007 Document Revised: 11/14/2014 Document Reviewed: 05/21/2014 Elsevier Interactive Patient Education [employer] Elsevier Inc.   Please return immediately if condition worsens. Please contact her primary physician or the physician you were given for referral. If you have any specialist physicians involved in her treatment and plan please also contact them. Thank you for using [place] [facility] emergency Department.

## 2015-09-19 NOTE — ED Provider Notes (Signed)
Time Seen: Approximately *1700  I have reviewed the triage notes  Chief Complaint: Pleurisy   History of Present Illness: Cynthia Mckay is a 35 y.o. female who presents describing to this historian substernal chest discomfort that started acutely earlier today at approximately noon. Patient has been under a lot of stress in her family. She states it hurts to take a deep breath. She denies any chest wall trauma but states it's been painful. She denies any abdominal pain. She doesn't feel that she is likely to be pregnant. She denies any abdominal pain, fever, productive cough, nausea, vomiting.   Past Medical History  Diagnosis Date  . Anemia     Patient Active Problem List   Diagnosis Date Noted  . Labor and delivery, indication for care 10/30/2014  . 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 Gallbladder surgery      Past Surgical History  Procedure Laterality Date  . Gallbladder surgery      Current Outpatient Rx  Name  Route  Sig  Dispense  Refill  . acetaminophen-codeine (TYLENOL #3) 300-30 MG tablet   Oral   Take 1 tablet by mouth every 8 (eight) hours as needed for moderate pain.   10 tablet   0   . ferrous fumarate (HEMOCYTE - 106 MG FE) 325 (106 FE) MG TABS tablet   Oral   Take 1 tablet by mouth.         Marland Kitchen. HYDROcodone-acetaminophen (NORCO) 5-325 MG tablet   Oral   Take 1 tablet by mouth every 6 (six) hours as needed for moderate pain.   20 tablet   0   . ibuprofen (ADVIL,MOTRIN) 400 MG tablet   Oral   Take 1 tablet (400 mg total) by mouth every 6 (six) hours as needed.   30 tablet   0   . penicillin v potassium (VEETID) 500 MG tablet   Oral   Take 1 tablet (500 mg total) by mouth 4 (four) times daily.   40 tablet   0   . Prenatal Vit-Fe Fumarate-FA (PRENATAL MULTIVITAMIN) TABS tablet   Oral   Take 1 tablet by mouth daily at 12 noon.           Allergies:  Tramadol  Family History: History  reviewed. No pertinent family history.  Social History: Social History  Substance Use Topics  . Smoking status: Current Every Day Smoker -- 0.50 packs/day    Types: Cigarettes  . Smokeless tobacco: None  . Alcohol Use: No     Review of Systems:   10 point review of systems was performed and was otherwise negative:  Constitutional: No fever Eyes: No visual disturbances ENT: No sore throat, ear pain Cardiac: Substernal sharp and exacerbated by deep inspiration Respiratory: No shortness of breath, wheezing, or stridor Abdomen: No abdominal pain, no vomiting, No diarrhea Endocrine: No weight loss, No night sweats Extremities: No peripheral edema, cyanosis Skin: No rashes, easy bruising Neurologic: No focal weakness, trouble with speech or swollowing Urologic: No dysuria, Hematuria, or urinary frequency No radiation to the back arm or jaw region Physical Exam:  ED Triage Vitals  Enc Vitals Group     BP 09/19/15 1553 91/56 mmHg     Pulse Rate 09/19/15 1553 84     Resp 09/19/15 1553 18     Temp 09/19/15 1553 98.1 F (36.7 C)     Temp Source 09/19/15 1553 Oral     SpO2 09/19/15 1553  97 %     Weight 09/19/15 1553 192 lb (87.091 kg)     Height 09/19/15 1553  (1.626 m)     Head Cir --      Peak Flow --      Pain Score 09/19/15 1554 6     Pain Loc --      Pain Edu? --      Excl. in GC? --     General: Awake , Alert , and Oriented times 3; GCS 15 Head: Normal cephalic , atraumatic Eyes: Pupils equal , round, reactive to light Nose/Throat: No nasal drainage, patent upper airway without erythema or exudate.  Neck: Supple, Full range of motion, No anterior adenopathy or palpable thyroid masses Lungs: Clear to ascultation without wheezes , rhonchi, or rales Heart: Regular rate, regular rhythm without murmurs , gallops , or rubs Abdomen: Soft, non tender without rebound, guarding , or rigidity; bowel sounds positive and symmetric in all 4 quadrants. No organomegaly .         Extremities: 2 plus symmetric pulses. No edema, clubbing or cyanosis Neurologic: normal ambulation, Motor symmetric without deficits, sensory intact Skin: warm, dry, no rashes Patient has reproducible pain to light palpation across the substernal region. She winces and states this is the area of her discomfort.  Labs:   All laboratory work was reviewed including any pertinent negatives or positives listed below:  Labs Reviewed  BASIC METABOLIC PANEL - Abnormal; Notable for the following:    Creatinine, Ser 0.40 (*)    All other components within normal limits  CBC  TROPONIN I  POCT PREGNANCY, URINE  POC URINE PREG, ED    EKG:  ED ECG REPORT I, Jennye Moccasin, the attending physician, personally viewed and interpreted this ECG.  Date: 09/19/2015 EKG Time: 1552 Rate: 86 Rhythm: normal sinus rhythm QRS Axis: normal Intervals: normal ST/T Wave abnormalities: normal Conduction Disturbances: none Narrative Interpretation: unremarkable Normal EKG   Radiology:     DG Chest 2 View (Final result) Result time: 09/19/15 16:31:27   Final result by Rad Results In Interface (09/19/15 16:31:27)   Narrative:   CLINICAL DATA: Left side chest pain, shortness of Breath  EXAM: CHEST 2 VIEW  COMPARISON: None.  FINDINGS: Cardiomediastinal silhouette is unremarkable. There is mild upper thoracic levoscoliosis. Mild lower thoracic dextroscoliosis. No infiltrate or pleural effusion. No pulmonary edema.  IMPRESSION: No active cardiopulmonary disease. Mild levoscoliosis upper thoracic spine. Mild dextroscoliosis lower thoracic spine.           I personally reviewed the radiologic studies     ED Course:  Patient's stay here was uneventful and dad her pain seems to be secondary to musculoskeletal chest wall pain. Her differential included all life-threatening causes for chest pain such as aortic dissection, acute coronary syndrome, pulmonary embolism, etc. Given  normal laboratory work and normal EKG with minimal risk factors I felt this was unlikely to be a life-threatening cause. Given the reproducible nature and the stress of the patient's been experiencing I felt this was musculoskeletal chest pain.    Assessment: Musculoskeletal chest wall pain   Plan:  Outpatient Patient was advised to return immediately if condition worsens. Patient was advised to follow up with their primary care physician or other specialized physicians involved in their outpatient care. The patient and/or family member/power of attorney had laboratory results reviewed at the bedside. All questions and concerns were addressed and appropriate discharge instructions were distributed by the nursing staff.  Jennye Moccasin, MD 09/19/15 [place]

## 2016-10-26 ENCOUNTER — Encounter: Payer: Self-pay | Admitting: Emergency Medicine

## 2016-10-26 ENCOUNTER — Emergency Department
Admission: EM | Admit: 2016-10-26 | Discharge: 2016-10-26 | Disposition: A | Discharge status: Home / Self Care | Payer: Medicaid Other | Attending: Emergency Medicine | Admitting: Emergency Medicine

## 2016-10-26 ENCOUNTER — Emergency Department: Payer: Medicaid Other

## 2016-10-26 DIAGNOSIS — N390 Urinary tract infection, site not specified: Secondary | ICD-10-CM | POA: Insufficient documentation

## 2016-10-26 DIAGNOSIS — F17210 Nicotine dependence, cigarettes, uncomplicated: Secondary | ICD-10-CM | POA: Diagnosis not present

## 2016-10-26 DIAGNOSIS — Z79899 Other long term (current) drug therapy: Secondary | ICD-10-CM | POA: Insufficient documentation

## 2016-10-26 DIAGNOSIS — R109 Unspecified abdominal pain: Secondary | ICD-10-CM | POA: Diagnosis present

## 2016-10-26 DIAGNOSIS — R0789 Other chest pain: Secondary | ICD-10-CM | POA: Diagnosis not present

## 2016-10-26 LAB — COMPREHENSIVE METABOLIC PANEL
ALT: 89 U/L — AB (ref 14–54)
AST: 69 U/L — AB (ref 15–41)
Albumin: 3.9 g/dL (ref 3.5–5.0)
Alkaline Phosphatase: 90 U/L (ref 38–126)
Anion gap: 8 (ref 5–15)
BUN: 9 mg/dL (ref 6–20)
CHLORIDE: 105 mmol/L (ref 101–111)
CO2: 26 mmol/L (ref 22–32)
CREATININE: 0.54 mg/dL (ref 0.44–1.00)
Calcium: 9.1 mg/dL (ref 8.9–10.3)
GFR calc Af Amer: >60 mL/min (ref >60)
GFR calc non Af Amer: >60 mL/min (ref >60)
GLUCOSE: 121 mg/dL — AB (ref 65–99)
Potassium: 4 mmol/L (ref 3.5–5.1)
Sodium: 139 mmol/L (ref 135–145)
Total Bilirubin: 0.5 mg/dL (ref 0.3–1.2)
Total Protein: 7.9 g/dL (ref 6.5–8.1)

## 2016-10-26 LAB — URINALYSIS, COMPLETE (UACMP) WITH MICROSCOPIC
BILIRUBIN URINE: NEGATIVE
Bacteria, UA: NONE SEEN
Glucose, UA: NEGATIVE mg/dL
Hgb urine dipstick: NEGATIVE
Ketones, ur: NEGATIVE mg/dL
Nitrite: NEGATIVE
Protein, ur: NEGATIVE mg/dL
SPECIFIC GRAVITY, URINE: 1.019 (ref 1.005–1.030)
pH: 7 (ref 5.0–8.0)

## 2016-10-26 LAB — CBC
HCT: 37.7 % (ref 35.0–47.0)
Hemoglobin: 13.1 g/dL (ref 12.0–16.0)
MCH: 31.3 pg (ref 26.0–34.0)
MCHC: 34.7 g/dL (ref 32.0–36.0)
MCV: 90.1 fL (ref 80.0–100.0)
PLATELETS: 167 10*3/uL (ref 150–440)
RBC: 4.19 MIL/uL (ref 3.80–5.20)
RDW: 12.9 % (ref 11.5–14.5)
WBC: 8.8 10*3/uL (ref 3.6–11.0)

## 2016-10-26 LAB — TROPONIN I

## 2016-10-26 LAB — PREGNANCY, URINE: Preg Test, Ur: NEGATIVE

## 2016-10-26 LAB — LIPASE, BLOOD: LIPASE: 34 U/L (ref 11–51)

## 2016-10-26 MED ORDER — ONDANSETRON HCL 4 MG/2ML IJ SOLN
4.0000 mg | Freq: Once | INTRAMUSCULAR | Status: AC
  Administered 2016-10-26: 4 mg via INTRAVENOUS
  Filled 2016-10-26: qty 2

## 2016-10-26 MED ORDER — FENTANYL CITRATE (PF) 100 MCG/2ML IJ SOLN
50.0000 ug | INTRAMUSCULAR | Status: DC | PRN
  Administered 2016-10-26: 50 ug via INTRAVENOUS
  Filled 2016-10-26: qty 2

## 2016-10-26 MED ORDER — CEPHALEXIN 500 MG PO CAPS
500.0000 mg | ORAL_CAPSULE | Freq: Once | ORAL | Status: AC
  Administered 2016-10-26: 500 mg via ORAL
  Filled 2016-10-26: qty 1

## 2016-10-26 MED ORDER — ONDANSETRON HCL 4 MG PO TABS: 4.0000 mg | ORAL_TABLET | Freq: Three times a day (TID) | ORAL | 0 refills | Status: DC | PRN

## 2016-10-26 MED ORDER — HYDROCODONE-ACETAMINOPHEN 5-325 MG PO TABS: 1.0000 | ORAL_TABLET | Freq: Four times a day (QID) | ORAL | 0 refills | Status: DC | PRN

## 2016-10-26 MED ORDER — KETOROLAC TROMETHAMINE 30 MG/ML IJ SOLN
30.0000 mg | Freq: Once | INTRAMUSCULAR | Status: AC
  Administered 2016-10-26: 30 mg via INTRAVENOUS
  Filled 2016-10-26: qty 1

## 2016-10-26 NOTE — ED Provider Notes (Signed)
[facility] Emergency Department Provider Note  ____________________________________________   I have reviewed the triage vital signs and the nursing notes.   HISTORY  Chief Complaint Flank Pain and Chest Pain    HPI Cynthia Mckay is a 36 y.o. female who is healthy presents here today complaining of cough. Patient's been coughing for 2-3 days. She states when she coughs it hurts her chest and her side to do so. She denies any hemoptysis, she denies any pleuritic chest pain. She states she hurt her chest wall coughing. She denies any fever or chills. She did try taking honey which seems to have relieved her cough. She denies fever or chills. She has pain in the right chest wall. It worse when she touches it or changes position. She denies any leg swelling, recent travel, birth control, pregnancy, she denies leg swelling, personal or family history of PE or DVT or ongoing pleuritic chest pain. The pain has been constant for a few days worse today. She is not taking any medication to relieve it. It is better when she lies still and worse when she touches it. It is not exertional. It is however positional. She denies abuse but does state that she's been under some stress recently.     Past Medical History:  Diagnosis Date  . Anemia     Patient Active Problem List   Diagnosis Date Noted  . Labor and delivery, indication for care 10/30/2014  . 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 GALLBLADDER SURGERY      Prior to Admission medications   Medication Sig Start Date End Date Taking? Authorizing Provider  acetaminophen-codeine (TYLENOL #3) 300-30 MG tablet Take 1 tablet by mouth every 8 (eight) hours as needed for moderate pain. 07/13/15   Menshew, Charlesetta Ivory, PA-C  ferrous fumarate (HEMOCYTE - 106 MG FE) 325 (106 FE) MG TABS tablet Take 1 tablet by mouth.    [provider]  HYDROcodone-acetaminophen (NORCO)  5-325 MG tablet Take 1 tablet by mouth every 6 (six) hours as needed for moderate pain. 09/19/15   Jennye Moccasin, MD  ibuprofen (ADVIL,MOTRIN) 400 MG tablet Take 1 tablet (400 mg total) by mouth every 6 (six) hours as needed. 09/19/15   Jennye Moccasin, MD  penicillin v potassium (VEETID) 500 MG tablet Take 1 tablet (500 mg total) by mouth 4 (four) times daily. 07/13/15   Menshew, Charlesetta Ivory, PA-C  Prenatal Vit-Fe Fumarate-FA (PRENATAL MULTIVITAMIN) TABS tablet Take 1 tablet by mouth daily at 12 noon.    [provider]    Allergies Tramadol  No family history on file.  Social History Social History  Substance Use Topics  . Smoking status: Current Every Day Smoker    Packs/day: 0.50    Types: Cigarettes  . Smokeless tobacco: Not on file  . Alcohol use No    Review of Systems Constitutional: No fever/chills Eyes: No visual changes. ENT: No sore throat. No stiff neck no neck pain Cardiovascular: See history of present illness Respiratory: Denies shortness of breath. Positive cough Gastrointestinal:   no vomiting.  No diarrhea.  No constipation. Genitourinary: Negative for dysuria. Musculoskeletal: Negative lower extremity swelling Skin: Negative for rash. Neurological: Negative for severe headaches, focal weakness or numbness.   ____________________________________________   PHYSICAL EXAM:  VITAL SIGNS: ED Triage Vitals  Enc Vitals Group     BP 10/26/16 1430 (!) 147/75     Pulse Rate 10/26/16 1430  83     Resp 10/26/16 1430 20     Temp 10/26/16 1430 99.3 F (37.4 C)     Temp Source 10/26/16 1430 Oral     SpO2 10/26/16 1430 97 %     Weight 10/26/16 1432 187 lb (84.8 kg)     Height 10/26/16 1432 5\' 4"  (1.626 m)     Head Circumference --      Peak Flow --      Pain Score 10/26/16 1430 10     Pain Loc --      Pain Edu? --      Excl. in GC? --     Constitutional: Alert and oriented. Well appearing and in no acute distress.Very anxious Eyes:  Conjunctivae are normal Head: Atraumatic HEENT: No congestion/rhinnorhea. Mucous membranes are moist.  Oropharynx non-erythematous Neck:   Nontender with no meningismus, no masses, no stridor Chest: Female nurse chaperone present, positive chest wall tenderness above the right breast. No breast mass or tenderness noted. When I touch area of her chest wall she states "ouch that's the pain right there" and pulls back. There is no crepitus. Flail chest or rib fracture palpated. She also has pain to the right ribs in the axillary line. Cardiovascular: Normal rate, regular rhythm. Grossly normal heart sounds.  Good peripheral circulation. Respiratory: Normal respiratory effort.  No retractions. Lungs CTAB. Abdominal: Soft and nontender. No distention. No guarding no rebound Back:  There is no focal tenderness or step off.  there is no midline tenderness there are no lesions noted. there is no CVA tenderness Musculoskeletal: No lower extremity tenderness, no upper extremity tenderness. No joint effusions, no DVT signs strong distal pulses no edema Neurologic:  Normal speech and language. No gross focal neurologic deficits are appreciated.  Skin:  Skin is warm, dry and intact. No rash noted. Psychiatric: Mood and affect are normal. Speech and behavior are normal.  ____________________________________________   LABS (all labs ordered are listed, but only abnormal results are displayed)  Labs Reviewed  COMPREHENSIVE METABOLIC PANEL - Abnormal; Notable for the following:       Result Value   Glucose, Bld 121 (*)    AST 69 (*)    ALT 89 (*)    All other components within normal limits  URINALYSIS, COMPLETE (UACMP) WITH MICROSCOPIC - Abnormal; Notable for the following:    Color, Urine YELLOW (*)    APPearance CLEAR (*)    Leukocytes, UA MODERATE (*)    Squamous Epithelial / LPF 0-5 (*)    All other components within normal limits  CBC  LIPASE, BLOOD  TROPONIN I  PREGNANCY, URINE  POC URINE  PREG, ED   ____________________________________________  EKG  I personally interpreted any EKGs ordered by me or triage Normal sinus rhythm rate 80 beats per an acute ST elevation or depression unremarkable EKG ____________________________________________  RADIOLOGY  I reviewed any imaging ordered by me or triage that were performed during my shift and, if possible, patient and/or family made aware of any abnormal findings. ____________________________________________   PROCEDURES  Procedure(s) performed: None  Procedures  Critical Care performed: None  ____________________________________________   INITIAL IMPRESSION / ASSESSMENT AND PLAN / ED COURSE  Pertinent labs & imaging results that were available during my care of the patient were reviewed by me and considered in my medical decision making (see chart for details).   Patient here with very reproducible chest wall pain, I have very low suspicion for ACS PE or dissection. Patient is  perk negative, with very reproducible chest wall pain. Chest x-ray is reassuring. I do not think a CT scan is in the patient's best interest. We will continue to monitor her here.    ____________________________________________   FINAL CLINICAL IMPRESSION(S) / ED DIAGNOSES  Final diagnoses:  None      This chart was dictated using voice recognition software.  Despite best efforts to proofread,  errors can occur which can change meaning.      Jeanmarie Plant, MD 10/26/16 [phone]

## 2016-10-26 NOTE — ED Triage Notes (Signed)
States R flank and R chest pain began yesterday. Had gall bladder out previously. No fall or injury.

## 2016-10-26 NOTE — Discharge Instructions (Signed)
Return to the emergency room for any new or worrisome symptoms including increased pain, vomiting, fever, shortness of breath or if you feel worse in any way. Follow closely with her doctor tomorrow.

## 2016-10-26 NOTE — ED Notes (Signed)
Signature pad in room not working. Patient verbalized understanding of discharge instructions and follow-up care. No further needs expressed at this time. Patient ambulatory to lobby with steady gait.

## 2016-10-28 LAB — URINE CULTURE

## 2017-08-27 ENCOUNTER — Emergency Department: Payer: Medicaid Other

## 2017-08-27 ENCOUNTER — Emergency Department
Admission: EM | Admit: 2017-08-27 | Discharge: 2017-08-27 | Disposition: A | Discharge status: Home / Self Care | Payer: Medicaid Other | Attending: Emergency Medicine | Admitting: Emergency Medicine

## 2017-08-27 ENCOUNTER — Other Ambulatory Visit: Payer: Self-pay

## 2017-08-27 DIAGNOSIS — F17210 Nicotine dependence, cigarettes, uncomplicated: Secondary | ICD-10-CM | POA: Insufficient documentation

## 2017-08-27 DIAGNOSIS — M545 Low back pain, unspecified: Secondary | ICD-10-CM

## 2017-08-27 DIAGNOSIS — Y999 Unspecified external cause status: Secondary | ICD-10-CM | POA: Insufficient documentation

## 2017-08-27 DIAGNOSIS — Y9301 Activity, walking, marching and hiking: Secondary | ICD-10-CM | POA: Diagnosis not present

## 2017-08-27 DIAGNOSIS — W0110XA Fall on same level from slipping, tripping and stumbling with subsequent striking against unspecified object, initial encounter: Secondary | ICD-10-CM | POA: Diagnosis not present

## 2017-08-27 DIAGNOSIS — Y92009 Unspecified place in unspecified non-institutional (private) residence as the place of occurrence of the external cause: Secondary | ICD-10-CM | POA: Insufficient documentation

## 2017-08-27 DIAGNOSIS — Z79899 Other long term (current) drug therapy: Secondary | ICD-10-CM | POA: Diagnosis not present

## 2017-08-27 DIAGNOSIS — W19XXXA Unspecified fall, initial encounter: Secondary | ICD-10-CM

## 2017-08-27 DIAGNOSIS — S20229A Contusion of unspecified back wall of thorax, initial encounter: Secondary | ICD-10-CM | POA: Insufficient documentation

## 2017-08-27 LAB — POC URINE PREG, ED: Preg Test, Ur: NEGATIVE

## 2017-08-27 MED ORDER — NABUMETONE 750 MG PO TABS: 750.0000 mg | ORAL_TABLET | Freq: Two times a day (BID) | ORAL | 0 refills | Status: DC

## 2017-08-27 MED ORDER — CYCLOBENZAPRINE HCL 10 MG PO TABS
10.0000 mg | ORAL_TABLET | Freq: Once | ORAL | Status: AC
  Administered 2017-08-27: 10 mg via ORAL
  Filled 2017-08-27: qty 1

## 2017-08-27 MED ORDER — CYCLOBENZAPRINE HCL 10 MG PO TABS: 10.0000 mg | ORAL_TABLET | Freq: Three times a day (TID) | ORAL | 0 refills | Status: DC | PRN

## 2017-08-27 MED ORDER — NAPROXEN 500 MG PO TABS
500.0000 mg | ORAL_TABLET | Freq: Once | ORAL | Status: AC
  Administered 2017-08-27: 500 mg via ORAL
  Filled 2017-08-27: qty 1

## 2017-08-27 NOTE — ED Triage Notes (Signed)
Lower back pain since slip and fall in the bathtub x4 days ago. Pt alert and oriented X4, active, cooperative, pt in NAD. RR even and unlabored, color WNL.

## 2017-08-27 NOTE — ED Notes (Signed)
See triage note  States she fell and hit her back on tub yesterday  Having pain across lower back  No bruising noted but area is tender to touch

## 2017-08-27 NOTE — ED Provider Notes (Signed)
[facility] Emergency Department Provider Note ____________________________________________  Time seen: 1839  I have reviewed the triage vital signs and the nursing notes.  HISTORY  Chief Complaint  Back Pain  HPI Cynthia Mckay is a 37 y.o. female presents to the ED for evaluation of continued low back pain after mechanical fall.  Patient describes she slipped on a wet floor last night while at home.  While in the bathtub.  She apparently fell hitting her mid lower back across the edge of the toe.  She denies any head injury or lacerations.  She presents now with pain to the bilateral lower back without distal paresthesias, footdrop, or incontinence.  She also denies any saddle paresthesias or weakness.  Past Medical History:  Diagnosis Date  . Anemia     Patient Active Problem List   Diagnosis Date Noted  . Labor and delivery, indication for care 10/30/2014  . 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 GALLBLADDER SURGERY      Prior to Admission medications   Medication Sig Start Date End Date Taking? Authorizing Provider  acetaminophen-codeine (TYLENOL #3) 300-30 MG tablet Take 1 tablet by mouth every 8 (eight) hours as needed for moderate pain. 07/13/15   Naureen Benton, Charlesetta IvoryJenise V Bacon, PA-C  cyclobenzaprine (FLEXERIL) 10 MG tablet Take 1 tablet (10 mg total) by mouth 3 (three) times daily as needed for muscle spasms. 08/27/17   Kelda Azad, Charlesetta IvoryJenise V Bacon, PA-C  ferrous fumarate (HEMOCYTE - 106 MG FE) 325 (106 FE) MG TABS tablet Take 1 tablet by mouth.    [provider]  HYDROcodone-acetaminophen (NORCO) 5-325 MG tablet Take 1 tablet by mouth every 6 (six) hours as needed for moderate pain. 10/26/16   Jeanmarie PlantMcShane, James A, MD  ibuprofen (ADVIL,MOTRIN) 400 MG tablet Take 1 tablet (400 mg total) by mouth every 6 (six) hours as needed. 09/19/15   Jennye MoccasinQuigley, Brian S, MD  nabumetone (RELAFEN) 750 MG tablet Take 1 tablet (750 mg  total) by mouth 2 (two) times daily. 08/27/17   Deyon Chizek, Charlesetta IvoryJenise V Bacon, PA-C  ondansetron (ZOFRAN) 4 MG tablet Take 1 tablet (4 mg total) by mouth every 8 (eight) hours as needed for nausea or vomiting. 10/26/16   Jeanmarie PlantMcShane, James A, MD  penicillin v potassium (VEETID) 500 MG tablet Take 1 tablet (500 mg total) by mouth 4 (four) times daily. 07/13/15   Kody Brandl, Charlesetta IvoryJenise V Bacon, PA-C  Prenatal Vit-Fe Fumarate-FA (PRENATAL MULTIVITAMIN) TABS tablet Take 1 tablet by mouth daily at 12 noon.    [provider]    Allergies Tramadol  No family history on file.  Social History Social History   Tobacco Use  . Smoking status: Current Every Day Smoker    Packs/day: 0.50    Types: Cigarettes  Substance Use Topics  . Alcohol use: No  . Drug use: No    Review of Systems  Constitutional: Negative for fever. Eyes: Negative for visual changes. ENT: Negative for sore throat. Cardiovascular: Negative for chest pain. Respiratory: Negative for shortness of breath. Gastrointestinal: Negative for abdominal pain, vomiting and diarrhea. Genitourinary: Negative for dysuria. Musculoskeletal: Positive for back pain. Skin: Negative for rash. Neurological: Negative for headaches, focal weakness or numbness. ____________________________________________  PHYSICAL EXAM:  VITAL SIGNS: ED Triage Vitals [08/27/17 1731]  Enc Vitals Group     BP (!) 124/53     Pulse Rate 83     Resp 16     Temp 98.5 F (36.9 C)  Temp Source Oral     SpO2 100 %     Weight 200 lb (90.7 kg)     Height 5\' 6"  (1.676 m)     Head Circumference      Peak Flow      Pain Score 8     Pain Loc      Pain Edu?      Excl. in GC?     Constitutional: Alert and oriented. Well appearing and in no distress. Head: Normocephalic and atraumatic. Eyes: Conjunctivae are normal. Normal extraocular movements Neck: Supple. No thyromegaly. Cardiovascular: Normal rate, regular rhythm. Normal distal pulses. Respiratory: Normal  respiratory effort. No wheezes/rales/rhonchi. Gastrointestinal: Soft and nontender. No distention. Musculoskeletal: No spinal alignment without midline tenderness, spasm, deformity, or step-off.  Patient without any obvious bruising, ecchymosis, or edema to the lower back musculature.  She is mildly tender to palpation over the bilateral lumbar musculature.  Patient transitions from sit to stand without assistance.  Nontender with normal range of motion in all extremities.  Neurologic: Cranial nerves II through XII grossly intact.  Normal gait without ataxia. Normal speech and language. No gross focal neurologic deficits are appreciated. Skin:  Skin is warm, dry and intact. No rash noted. ____________________________________________  LABORATORY  Labs Reviewed  POC URINE PREG, ED   _____________________________________________  RADIOLOGY  Lumbar spine Negative for acute findings Mild DDD at L3-4 ____________________________________________  PROCEDURES  Procedures Flexeril 10 mg p.o. Naproxen 500 mg p.o. ____________________________________________  INITIAL IMPRESSION / ASSESSMENT AND PLAN / ED COURSE  Patient with ED evaluation of injury sustained following mechanical fall at home.  She hit her back on the tub today.  Her exam is overall benign and her x-ray is reassuring as it shows no acute fracture or dislocation to the lumbar spine.  She will be discharged with a prescription for Relafen and Flexeril dose as directed.  She will follow-up with her primary provider or return to the ED as discussed. ____________________________________________  FINAL CLINICAL IMPRESSION(S) / ED DIAGNOSES  Final diagnoses:  Acute midline low back pain without sciatica  Fall at home, initial encounter  Contusion of back, unspecified laterality, initial encounter      Lissa Hoard, PA-C 08/27/17 2256    Jeanmarie Plant, MD 08/27/17 2348

## 2017-08-27 NOTE — Discharge Instructions (Addendum)
Your exam and x-ray are negative for acute fracture or dislocation. You have some mild scoliosis and mild DDD at L3-L4. Take the prescription meds as directed. Apply ice or moist heat to reduce pain. Follow-up with your provider for ongoing symptoms.

## 2017-08-27 NOTE — ED Notes (Signed)
First Nurse Note: Pt ambulatory to ED c/o lower back pain. Pt is in NAD at this time.

## 2020-01-31 ENCOUNTER — Other Ambulatory Visit: Payer: Self-pay

## 2020-01-31 ENCOUNTER — Ambulatory Visit (LOCAL_COMMUNITY_HEALTH_CENTER): Payer: Medicaid Other

## 2020-01-31 VITALS — BP 122/68 | Ht 66.0 in | Wt 258.5 lb

## 2020-01-31 DIAGNOSIS — Z3201 Encounter for pregnancy test, result positive: Secondary | ICD-10-CM

## 2020-01-31 LAB — PREGNANCY, URINE: Preg Test, Ur: POSITIVE — AB

## 2020-01-31 MED ORDER — PRENATAL 27-0.8 MG PO TABS: 1.0000 | ORAL_TABLET | Freq: Every day | ORAL | 0 refills | Status: AC

## 2020-01-31 NOTE — Progress Notes (Signed)
UPT positive today. Plans care at [facility]. Based on LMP, pt is 12 weeks. RN encouraged pt to establish prenatal care ASAP. Pt in agreement. Sent to clerk for preadmit. Jerel Shepherd, RN

## 2020-03-09 NOTE — L&D Delivery Note (Signed)
Date of delivery: 06/27/2020 Estimated Date of Delivery: 07/28/20 Patient's last menstrual period was 11/08/2019 (approximate). EGA: [redacted]w[redacted]d  Delivery Note At 5:48 AM a viable female was delivered via Vaginal Spontaneous (Presentation: Right Occiput Anterior).   APGAR: 8, 9; weight: 5 pounds 7 ounces Placenta status: Spontaneous, Intact.   Cord: 3 vessels with the following complications: None.  Cord pH: NA  Called to see patient.  Mom pushed to deliver a viable female infant.  The head followed by shoulders, which delivered without difficulty, and the rest of the body.  No nuchal cord noted.  Baby to mom's chest.  Cord clamped and cut after 3 min delay.  No cord blood obtained.  Placenta delivered spontaneously, intact, with a 3-vessel cord.  All counts correct.  Hemostasis obtained with IV pitocin and fundal massage.   Anesthesia: Epidural Episiotomy: None Lacerations: None Suture Repair: NA Est. Blood Loss (mL): 200  Mom to postpartum.  Baby to special care nursery for initial transition.  Tresea Mall, CNM 06/27/2020, 6:33 AM

## 2020-04-02 ENCOUNTER — Ambulatory Visit: Payer: Medicaid Other | Admitting: Advanced Practice Midwife

## 2020-04-02 ENCOUNTER — Other Ambulatory Visit: Payer: Self-pay

## 2020-04-02 ENCOUNTER — Encounter: Payer: Self-pay | Admitting: Advanced Practice Midwife

## 2020-04-02 VITALS — BP 102/59 | HR 108 | Temp 98.0°F | Wt 258.8 lb

## 2020-04-02 DIAGNOSIS — O0930 Supervision of pregnancy with insufficient antenatal care, unspecified trimester: Secondary | ICD-10-CM

## 2020-04-02 DIAGNOSIS — F17200 Nicotine dependence, unspecified, uncomplicated: Secondary | ICD-10-CM

## 2020-04-02 DIAGNOSIS — T7422XA Child sexual abuse, confirmed, initial encounter: Secondary | ICD-10-CM | POA: Insufficient documentation

## 2020-04-02 DIAGNOSIS — Z641 Problems related to multiparity: Secondary | ICD-10-CM

## 2020-04-02 DIAGNOSIS — O0992 Supervision of high risk pregnancy, unspecified, second trimester: Secondary | ICD-10-CM

## 2020-04-02 DIAGNOSIS — O2200 Varicose veins of lower extremity in pregnancy, unspecified trimester: Secondary | ICD-10-CM

## 2020-04-02 DIAGNOSIS — O99012 Anemia complicating pregnancy, second trimester: Secondary | ICD-10-CM | POA: Insufficient documentation

## 2020-04-02 DIAGNOSIS — O99210 Obesity complicating pregnancy, unspecified trimester: Secondary | ICD-10-CM | POA: Insufficient documentation

## 2020-04-02 DIAGNOSIS — T7422XS Child sexual abuse, confirmed, sequela: Secondary | ICD-10-CM

## 2020-04-02 DIAGNOSIS — O99212 Obesity complicating pregnancy, second trimester: Secondary | ICD-10-CM

## 2020-04-02 LAB — URINALYSIS
Bilirubin, UA: NEGATIVE
Glucose, UA: NEGATIVE
Ketones, UA: NEGATIVE
Leukocytes,UA: NEGATIVE
Nitrite, UA: NEGATIVE
Specific Gravity, UA: 1.02 (ref 1.005–1.030)
Urobilinogen, Ur: 1 mg/dL (ref 0.2–1.0)
pH, UA: 7 (ref 5.0–7.5)

## 2020-04-02 LAB — HEMOGLOBIN, FINGERSTICK: Hemoglobin: 10.1 g/dL — ABNORMAL LOW (ref 11.1–15.9)

## 2020-04-02 LAB — WET PREP FOR TRICH, YEAST, CLUE
Trichomonas Exam: NEGATIVE
Yeast Exam: NEGATIVE

## 2020-04-02 MED ORDER — IRON (FERROUS SULFATE) 325 (65 FE) MG PO TABS: 1.0000 | ORAL_TABLET | Freq: Every day | ORAL | 0 refills | Status: DC

## 2020-04-02 MED ORDER — METRONIDAZOLE 500 MG PO TABS: 250.0000 mg | ORAL_TABLET | Freq: Three times a day (TID) | ORAL | 0 refills | Status: AC

## 2020-04-02 NOTE — Progress Notes (Signed)
Patient treated per standing orders for Anemia and BV. Tawny Hopping, RN
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
## 2020-04-02 NOTE — Progress Notes (Signed)
Here today for 20.6 week MH IP. Taking PNV QD. Denies ED/hospital visits since +PT. Declines Flu vaccine secondary to "I've already had it." Complains of lower abdominal pain x 2 weeks. Denies frequency, burning or urgency with urination. Early 1 hour gtt today.  Tawny Hopping, RN

## 2020-04-03 ENCOUNTER — Encounter: Payer: Self-pay | Admitting: Advanced Practice Midwife

## 2020-04-04 LAB — GLUCOSE, 1 HOUR GESTATIONAL: Gestational Diabetes Screen: 94 mg/dL (ref 65–139)

## 2020-04-04 LAB — FE+CBC/D/PLT+TIBC+FER+RETIC
Basophils Absolute: 0 10*3/uL (ref 0.0–0.2)
Basos: 0 %
EOS (ABSOLUTE): 0.1 10*3/uL (ref 0.0–0.4)
Eos: 2 %
Ferritin: 87 ng/mL (ref 15–150)
Hematocrit: 29.2 % — ABNORMAL LOW (ref 34.0–46.6)
Hemoglobin: 10.1 g/dL — ABNORMAL LOW (ref 11.1–15.9)
Immature Grans (Abs): 0.1 10*3/uL (ref 0.0–0.1)
Immature Granulocytes: 1 %
Iron Saturation: 21 % (ref 15–55)
Iron: 62 ug/dL (ref 27–159)
Lymphocytes Absolute: 1.3 10*3/uL (ref 0.7–3.1)
Lymphs: 14 %
MCH: 32.3 pg (ref 26.6–33.0)
MCHC: 34.6 g/dL (ref 31.5–35.7)
MCV: 93 fL (ref 79–97)
Monocytes Absolute: 0.3 10*3/uL (ref 0.1–0.9)
Monocytes: 4 %
Neutrophils Absolute: 7.2 10*3/uL — ABNORMAL HIGH (ref 1.4–7.0)
Neutrophils: 79 %
Platelets: 135 10*3/uL — ABNORMAL LOW (ref 150–450)
RBC: 3.13 x10E6/uL — ABNORMAL LOW (ref 3.77–5.28)
RDW: 13.3 % (ref 11.7–15.4)
Retic Ct Pct: 4.4 % — ABNORMAL HIGH (ref 0.6–2.6)
Total Iron Binding Capacity: 296 ug/dL (ref 250–450)
UIBC: 234 ug/dL (ref 131–425)
WBC: 8.9 10*3/uL (ref 3.4–10.8)

## 2020-04-04 LAB — COMPREHENSIVE METABOLIC PANEL
ALT: 14 IU/L (ref 0–32)
AST: 17 IU/L (ref 0–40)
Albumin/Globulin Ratio: 1.1 — ABNORMAL LOW (ref 1.2–2.2)
Albumin: 3.5 g/dL — ABNORMAL LOW (ref 3.8–4.8)
Alkaline Phosphatase: 98 IU/L (ref 44–121)
BUN/Creatinine Ratio: 14 (ref 9–23)
BUN: 6 mg/dL (ref 6–20)
Bilirubin Total: 0.3 mg/dL (ref 0.0–1.2)
CO2: 22 mmol/L (ref 20–29)
Calcium: 9.1 mg/dL (ref 8.7–10.2)
Chloride: 104 mmol/L (ref 96–106)
Creatinine, Ser: 0.44 mg/dL — ABNORMAL LOW (ref 0.57–1.00)
GFR calc Af Amer: 149 mL/min/{1.73_m2} (ref >59)
GFR calc non Af Amer: 129 mL/min/{1.73_m2} (ref >59)
Globulin, Total: 3.3 g/dL (ref 1.5–4.5)
Glucose: 92 mg/dL (ref 65–99)
Potassium: 4.2 mmol/L (ref 3.5–5.2)
Sodium: 138 mmol/L (ref 134–144)
Total Protein: 6.8 g/dL (ref 6.0–8.5)

## 2020-04-04 LAB — CBC/D/PLT+RPR+RH+ABO+AB SCR
Antibody Screen: NEGATIVE
Hepatitis B Surface Ag: NEGATIVE
RPR Ser Ql: NONREACTIVE
Rh Factor: POSITIVE

## 2020-04-04 LAB — HIV-1/HIV-2 QUALITATIVE RNA
HIV-1 RNA, Qualitative: NONREACTIVE
HIV-2 RNA, Qualitative: NONREACTIVE

## 2020-04-04 LAB — CHLAMYDIA/GC NAA, CONFIRMATION
Chlamydia trachomatis, NAA: NEGATIVE
Neisseria gonorrhoeae, NAA: NEGATIVE

## 2020-04-04 LAB — HCV INTERPRETATION

## 2020-04-04 LAB — HCV AB W REFLEX TO QUANT PCR: HCV Ab: <0.1 s/co ratio (ref 0.0–0.9)

## 2020-04-04 LAB — HGB A1C W/O EAG: Hgb A1c MFr Bld: 4.7 % — ABNORMAL LOW (ref 4.8–5.6)

## 2020-04-04 LAB — TSH: TSH: 0.366 u[IU]/mL — ABNORMAL LOW (ref 0.450–4.500)

## 2020-04-05 ENCOUNTER — Encounter: Payer: Self-pay | Admitting: Advanced Practice Midwife

## 2020-04-05 ENCOUNTER — Telehealth: Payer: Self-pay

## 2020-04-05 DIAGNOSIS — R799 Abnormal finding of blood chemistry, unspecified: Secondary | ICD-10-CM | POA: Insufficient documentation

## 2020-04-05 LAB — PROTEIN / CREATININE RATIO, URINE
Creatinine, Urine: 120.1 mg/dL
Protein, Ur: 15.6 mg/dL
Protein/Creat Ratio: 130 mg/g creat (ref 0–200)

## 2020-04-05 LAB — 789231 7+OXYCODONE-BUND
Amphetamines, Urine: NEGATIVE ng/mL
BENZODIAZ UR QL: NEGATIVE ng/mL
Barbiturate screen, urine: NEGATIVE ng/mL
Cannabinoid Quant, Ur: NEGATIVE ng/mL
Cocaine (Metab.): NEGATIVE ng/mL
OPIATE SCREEN URINE: NEGATIVE ng/mL
Oxycodone/Oxymorphone, Urine: NEGATIVE ng/mL
PCP Quant, Ur: NEGATIVE ng/mL

## 2020-04-05 LAB — URINE CULTURE

## 2020-04-05 NOTE — Telephone Encounter (Signed)
Per order of E. Sciora CNM, client scheduled for TSH / Free T4 / T3 on 04/08/2020. Client requested pm appt on 04/08/2020. Jossie Ng, RN

## 2020-04-06 LAB — IGP, APTIMA HPV
HPV Aptima: NEGATIVE
PAP Smear Comment: 0

## 2020-04-08 ENCOUNTER — Telehealth: Payer: Self-pay

## 2020-04-08 ENCOUNTER — Other Ambulatory Visit: Payer: Self-pay

## 2020-04-08 NOTE — Telephone Encounter (Signed)
TC to patient to reschedule missed lab appointment. Patient states she is having car trouble and couldn't make it here today. Patient states she can come after her U/S on 04/10/2020. Patient scheduled to come for lab appointment on 04/10/2020 and told to arrive at 1:00pm. Patient states understanding and agrees with plan.Burt Knack, RN

## 2020-04-10 ENCOUNTER — Other Ambulatory Visit: Payer: Self-pay

## 2020-04-10 ENCOUNTER — Other Ambulatory Visit: Payer: Self-pay | Admitting: Obstetrics and Gynecology

## 2020-04-10 ENCOUNTER — Other Ambulatory Visit: Payer: Medicaid Other

## 2020-04-10 ENCOUNTER — Ambulatory Visit (INDEPENDENT_AMBULATORY_CARE_PROVIDER_SITE_OTHER): Payer: Medicaid Other

## 2020-04-10 DIAGNOSIS — O0930 Supervision of pregnancy with insufficient antenatal care, unspecified trimester: Secondary | ICD-10-CM

## 2020-04-10 DIAGNOSIS — Z3A24 24 weeks gestation of pregnancy: Secondary | ICD-10-CM | POA: Diagnosis not present

## 2020-04-10 DIAGNOSIS — Z3687 Encounter for antenatal screening for uncertain dates: Secondary | ICD-10-CM

## 2020-04-10 DIAGNOSIS — Z3689 Encounter for other specified antenatal screening: Secondary | ICD-10-CM

## 2020-04-10 DIAGNOSIS — R799 Abnormal finding of blood chemistry, unspecified: Secondary | ICD-10-CM

## 2020-04-10 NOTE — Progress Notes (Signed)
Pt in lab for blood draw for TSH, Free T4, T3 per order E. Sciora, CNM. Jerel Shepherd, RN

## 2020-04-11 LAB — T3: T3, Total: 226 ng/dL — ABNORMAL HIGH (ref 71–180)

## 2020-04-11 LAB — TSH+FREE T4
Free T4: 1.07 ng/dL (ref 0.82–1.77)
TSH: 0.43 u[IU]/mL — ABNORMAL LOW (ref 0.450–4.500)

## 2020-04-12 ENCOUNTER — Encounter: Payer: Self-pay | Admitting: Advanced Practice Midwife

## 2020-04-12 DIAGNOSIS — R799 Abnormal finding of blood chemistry, unspecified: Secondary | ICD-10-CM | POA: Insufficient documentation

## 2020-04-15 ENCOUNTER — Telehealth: Payer: Self-pay

## 2020-04-15 NOTE — Progress Notes (Signed)
[facility] referral with snapshot pages faxed to scheduling number with fax confirmation received. MFM referral, snapshot pages and records (including all labs and [place] report) faxed to [facility] MFM clinic with fax confirmation received. Jossie Ng, RN

## 2020-04-15 NOTE — Telephone Encounter (Signed)
Call received from Clydie Braun MFM scheduler with consult appt for client on 04/23/2020 at 11:00 am (due to thyroid function tests). Call to client's cell and per recorded message, number has been changed, discontinued or no longer in service. Call to client at home number of [phone] and left message to call as soon as possible regarding consult appt for abnormal labs. Number to call provided. Jossie Ng, RN

## 2020-04-16 ENCOUNTER — Telehealth: Payer: Self-pay | Admitting: Student

## 2020-04-16 NOTE — Telephone Encounter (Signed)
TC from patient, returning call to RN for MFM appointment information. Patient given information details for [facility] MFM consult for thyroid function 04/23/20. Patient expresses concern and wants information on other lab results that were taken during initial MH visit. Re counseled to take iron with OJ, and separate from PNV. Patient reminded of next RV appointment on 04/30/20. Has no further questions or concerns at this time. Sharlyne Pacas, RN

## 2020-04-17 NOTE — Telephone Encounter (Signed)
Phone call to [phone]. Received message saying that number has changed, disconnected, or no longer in service.  Phone call to [phone]. Pt states someone already called her about her 04/23/20 appt. Confirmed details of appt time and place with pt. Pt expressed understanding.

## 2020-04-18 ENCOUNTER — Telehealth: Payer: Self-pay

## 2020-04-18 ENCOUNTER — Encounter: Payer: Self-pay | Admitting: Advanced Practice Midwife

## 2020-04-18 DIAGNOSIS — O0992 Supervision of high risk pregnancy, unspecified, second trimester: Secondary | ICD-10-CM

## 2020-04-18 NOTE — Telephone Encounter (Signed)
TC to [facility] clinic as follow-up to call from [facility] at [facility] MFM scheduling this morning with the question about whether patient needs U/S with her consult appointment on 04/23/2020 at 11:00am. RN talked with Corrie Dandy at [facility] MFM clinic to let her know that per provider E. Sciora, patient does not need additional U/S. Patient had anatomy U/S on 04/10/2020. Mary informed that an MH RN was able to contact patient and give her the appointment date/time/location.Burt Knack, RN

## 2020-04-22 ENCOUNTER — Telehealth: Payer: Self-pay | Admitting: Student

## 2020-04-22 NOTE — Telephone Encounter (Signed)
Incoming call from patient around 9am, expressing symptoms. Reports cough, but denies fever, n/v, headache. States that she does not have tylenol and wants to take ibuprofen. Counseled NOT to take ibuprofen or Tylenol Cold/Flu and to take regular tylenol/acetaminophen ONLY. Per E. Sciora, recommends that patient receive a COVID test. Pt has not received any dose of COVID-19 vaccine. Pt reports that she is not able to get a tested today d/t transportation. Patient made aware that her U/S and MFM consult will be rescheduled. Will f/u with patient to discuss new appointment date and time. Sharlyne Pacas, RN

## 2020-04-23 ENCOUNTER — Other Ambulatory Visit: Payer: Self-pay

## 2020-04-23 ENCOUNTER — Ambulatory Visit: Payer: Self-pay

## 2020-04-24 ENCOUNTER — Other Ambulatory Visit: Payer: Self-pay | Admitting: Advanced Practice Midwife

## 2020-04-24 ENCOUNTER — Telehealth: Payer: Self-pay

## 2020-04-24 DIAGNOSIS — O99330 Smoking (tobacco) complicating pregnancy, unspecified trimester: Secondary | ICD-10-CM

## 2020-04-24 DIAGNOSIS — O0930 Supervision of pregnancy with insufficient antenatal care, unspecified trimester: Secondary | ICD-10-CM

## 2020-04-24 DIAGNOSIS — O99210 Obesity complicating pregnancy, unspecified trimester: Secondary | ICD-10-CM

## 2020-04-24 NOTE — Telephone Encounter (Addendum)
TC to patient to follow-up on cough/symtoms, and covid testing. Patient states she went for covid testing yesterday, 04/23/20, and no longer has any cough. She denies all other symptoms including fever, sore throat, loss of taste or smell. TC to [facility] MFM and patient rescheduled for 04/30/2020, U/S at 9:00, consult at 10:00. Patient informed of new appointment day/time. Patient counseled to arrive 8:45am without children, as long as no new symptoms develop, regardless of test results, due to CDC recommendations to stay home for 5 days after positive test (if she tests positive), as long as symptoms improving. Patient maternity appointment rescheduled to later time on 04/30/20 to allow for [facility] MFM appointment. Patient states understanding of appointment times and denies questions.Burt Knack, RN

## 2020-04-25 NOTE — Telephone Encounter (Signed)
Consulted on the plan of care for this client.  I agree with the documented note and actions taken to provide care for this client.  E. Antrice Pal, CNM  

## 2020-04-29 ENCOUNTER — Telehealth: Payer: Self-pay | Admitting: Family Medicine

## 2020-04-29 NOTE — Telephone Encounter (Signed)
PATIENT HAS QUESTION ABOUT COVID AND HER UPCOMING U/S APPT

## 2020-04-29 NOTE — Telephone Encounter (Signed)
Client with questions related to harmful affects for baby if mother has Covid during pregnancy as her sister gave her some information from the internet that has scared her since she has had Covid recently. Client encouraged to discuss her concerns with provider at consult appt tomorrow after [place] [facility] MFM - thyroid labs). Also counseled that can discuss concerns with provider at [facility] RV appt tomorrow. Client reports resolution of all symptoms. Jossie Ng, RN

## 2020-04-30 ENCOUNTER — Ambulatory Visit: Payer: Medicaid Other

## 2020-04-30 ENCOUNTER — Telehealth: Payer: Self-pay | Admitting: Advanced Practice Midwife

## 2020-04-30 ENCOUNTER — Telehealth: Payer: Self-pay | Admitting: Family Medicine

## 2020-04-30 ENCOUNTER — Encounter: Payer: Self-pay | Admitting: Advanced Practice Midwife

## 2020-04-30 DIAGNOSIS — O09529 Supervision of elderly multigravida, unspecified trimester: Secondary | ICD-10-CM | POA: Insufficient documentation

## 2020-04-30 DIAGNOSIS — E0590 Thyrotoxicosis, unspecified without thyrotoxic crisis or storm: Secondary | ICD-10-CM | POA: Insufficient documentation

## 2020-04-30 DIAGNOSIS — U071 COVID-19: Secondary | ICD-10-CM | POA: Insufficient documentation

## 2020-04-30 NOTE — Telephone Encounter (Signed)
T/C to discuss [facility] apt today with [facility]. MD looked over her labs and dx'd her with hyperthyroidism today and stated she needs referral to endocrinologist. Informed pt [facility] endocrinology will call her with an apt and this is very important to go to this apt.  Pt states she had an apt here with [place] today but we called and rescheduled for 05/02/20. Pt concerned about +Covid on 04/23/20.  Discussed with pt to inform ultrasonographer
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
## 2020-04-30 NOTE — Telephone Encounter (Signed)
Return call to client and notified of new [facility] [place] and consult appt scheduled for 05/07/2020 at 2 pm ([place]) and 3 pm (consult). Client requested [facility] RV appt today be rescheduled for 05/02/2020 and to arrive that day at 0845. Jossie Ng, RN

## 2020-05-01 ENCOUNTER — Telehealth: Payer: Self-pay

## 2020-05-01 NOTE — Telephone Encounter (Signed)
[facility] Endocrinology referral with snapshot pages and all records faxed with confirmation received. Call to Endocrinology and verified 1) receipt of fax (54 pages) and 2) they accept Medicaid. Per receptionist, MD will review records and notify her of when to schedule appt. Receptionist aware client is pregnant. Per receptionist, when notified to schedule appt, they will call client with appt. Jossie Ng, RN

## 2020-05-02 ENCOUNTER — Encounter: Payer: Self-pay | Admitting: Physician Assistant

## 2020-05-02 ENCOUNTER — Ambulatory Visit: Payer: Medicaid Other | Admitting: Physician Assistant

## 2020-05-02 ENCOUNTER — Other Ambulatory Visit: Payer: Self-pay

## 2020-05-02 VITALS — BP 106/62 | HR 95 | Temp 96.5°F | Wt 259.6 lb

## 2020-05-02 DIAGNOSIS — O99012 Anemia complicating pregnancy, second trimester: Secondary | ICD-10-CM

## 2020-05-02 DIAGNOSIS — O99212 Obesity complicating pregnancy, second trimester: Secondary | ICD-10-CM

## 2020-05-02 DIAGNOSIS — O0992 Supervision of high risk pregnancy, unspecified, second trimester: Secondary | ICD-10-CM

## 2020-05-02 DIAGNOSIS — R799 Abnormal finding of blood chemistry, unspecified: Secondary | ICD-10-CM

## 2020-05-02 LAB — URINALYSIS
Bilirubin, UA: NEGATIVE
Glucose, UA: NEGATIVE
Ketones, UA: NEGATIVE
Leukocytes,UA: NEGATIVE
Nitrite, UA: NEGATIVE
Specific Gravity, UA: 1.03 (ref 1.005–1.030)
Urobilinogen, Ur: 1 mg/dL (ref 0.2–1.0)
pH, UA: 6 (ref 5.0–7.5)

## 2020-05-02 LAB — HEMOGLOBIN, FINGERSTICK: Hemoglobin: 9.7 g/dL — ABNORMAL LOW (ref 11.1–15.9)

## 2020-05-02 NOTE — Addendum Note (Signed)
Addended by: Burt Knack on: 05/02/2020 11:28 AM   Modules accepted: Orders

## 2020-05-02 NOTE — Progress Notes (Signed)
Pt denies visits to ER since last appt with [facility]. Pt is currently taking Vitamin C, PNV, and iron. Tdap vaccine declined, pt accepted VIS sheet to review. Pt received influenza vaccine at [employer]. Pt concerned about Covid and the affects it could have on placenta and baby and her thyroid.
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
## 2020-05-02 NOTE — Progress Notes (Signed)
Hgb 9.7, patient counseled to take iron supplement twice daily with juice. Patient states understanding. Urine culture and urine spot prot/creat added to orders, per provider VO.Marland KitchenBurt Knack, RN

## 2020-05-03 ENCOUNTER — Telehealth: Payer: Self-pay

## 2020-05-03 ENCOUNTER — Telehealth: Payer: Self-pay | Admitting: Student

## 2020-05-03 DIAGNOSIS — O24419 Gestational diabetes mellitus in pregnancy, unspecified control: Secondary | ICD-10-CM | POA: Insufficient documentation

## 2020-05-03 DIAGNOSIS — O99810 Abnormal glucose complicating pregnancy: Secondary | ICD-10-CM | POA: Insufficient documentation

## 2020-05-03 LAB — CBC WITH DIFFERENTIAL/PLATELET
Basophils Absolute: 0 10*3/uL (ref 0.0–0.2)
Basos: 0 %
EOS (ABSOLUTE): 0.1 10*3/uL (ref 0.0–0.4)
Eos: 1 %
Hematocrit: 28.6 % — ABNORMAL LOW (ref 34.0–46.6)
Hemoglobin: 9.8 g/dL — ABNORMAL LOW (ref 11.1–15.9)
Immature Grans (Abs): 0.1 10*3/uL (ref 0.0–0.1)
Immature Granulocytes: 2 %
Lymphocytes Absolute: 1.4 10*3/uL (ref 0.7–3.1)
Lymphs: 23 %
MCH: 31.7 pg (ref 26.6–33.0)
MCHC: 34.3 g/dL (ref 31.5–35.7)
MCV: 93 fL (ref 79–97)
Monocytes Absolute: 0.3 10*3/uL (ref 0.1–0.9)
Monocytes: 5 %
Neutrophils Absolute: 4.2 10*3/uL (ref 1.4–7.0)
Neutrophils: 69 %
Platelets: 125 10*3/uL — ABNORMAL LOW (ref 150–450)
RBC: 3.09 x10E6/uL — ABNORMAL LOW (ref 3.77–5.28)
RDW: 13.5 % (ref 11.7–15.4)
WBC: 6 10*3/uL (ref 3.4–10.8)

## 2020-05-03 LAB — PROTEIN / CREATININE RATIO, URINE
Creatinine, Urine: 127.2 mg/dL
Protein, Ur: 39.5 mg/dL
Protein/Creat Ratio: 311 mg/g creat — ABNORMAL HIGH (ref 0–200)

## 2020-05-03 LAB — GLUCOSE, 1 HOUR GESTATIONAL: Gestational Diabetes Screen: 138 mg/dL (ref 65–139)

## 2020-05-03 NOTE — Telephone Encounter (Signed)
TC to patient to inform of elevated 1 hour gtt (138). Needs 3 hour scheduled. LM with number to call. Burt Knack, RN

## 2020-05-03 NOTE — Telephone Encounter (Signed)
RC from patient. This RN made pt aware of need for 3hr gtt, but was unable to schedule on blocked clinic schedule. This RN will RC to patient with available dates and times.   Attempt call to pt x2 to schedule 3hr apt. Unable to contact pt. Sharlyne Pacas, RN

## 2020-05-04 LAB — URINE CULTURE

## 2020-05-04 LAB — HIV-1/HIV-2 QUALITATIVE RNA
HIV-1 RNA, Qualitative: NONREACTIVE
HIV-2 RNA, Qualitative: NONREACTIVE

## 2020-05-04 LAB — RPR: RPR Ser Ql: NONREACTIVE

## 2020-05-06 ENCOUNTER — Telehealth: Payer: Self-pay

## 2020-05-06 NOTE — Telephone Encounter (Signed)
TC to patient to inform of elevated 1 hour gtt. Patient already aware. Patient scheduled for 3 hour gtt and counseled about fasting 12 hours before test. Patient scheduled for 05/07/2020 for 3 hour gtt per patient request. Patient also has U/S at 2:00 and is aware. Patient states she understands the fasting protocol and will arrive in the morning as close to 8:00 am as she can (after getting her kids off to school). Marland KitchenBurt Knack, RN

## 2020-05-06 NOTE — Telephone Encounter (Signed)
Call to [facility] Endocrinology to ascertain if appt scheduled (referral/records faxed 05/01/20). Per receptionist, MD has records and not yet notified her of when to schedule appt for client. Jossie Ng, RN

## 2020-05-07 ENCOUNTER — Ambulatory Visit: Payer: Medicaid Other | Attending: Maternal & Fetal Medicine

## 2020-05-07 ENCOUNTER — Other Ambulatory Visit: Payer: Medicaid Other

## 2020-05-07 ENCOUNTER — Other Ambulatory Visit: Payer: Self-pay

## 2020-05-07 ENCOUNTER — Encounter: Payer: Self-pay | Admitting: Family Medicine

## 2020-05-07 ENCOUNTER — Ambulatory Visit (HOSPITAL_BASED_OUTPATIENT_CLINIC_OR_DEPARTMENT_OTHER): Payer: Medicaid Other | Admitting: Maternal & Fetal Medicine

## 2020-05-07 DIAGNOSIS — Z3A28 28 weeks gestation of pregnancy: Secondary | ICD-10-CM

## 2020-05-07 DIAGNOSIS — O99213 Obesity complicating pregnancy, third trimester: Secondary | ICD-10-CM

## 2020-05-07 DIAGNOSIS — O0930 Supervision of pregnancy with insufficient antenatal care, unspecified trimester: Secondary | ICD-10-CM | POA: Diagnosis not present

## 2020-05-07 DIAGNOSIS — O99113 Other diseases of the blood and blood-forming organs and certain disorders involving the immune mechanism complicating pregnancy, third trimester: Secondary | ICD-10-CM | POA: Diagnosis not present

## 2020-05-07 DIAGNOSIS — O99210 Obesity complicating pregnancy, unspecified trimester: Secondary | ICD-10-CM | POA: Diagnosis present

## 2020-05-07 DIAGNOSIS — O99333 Smoking (tobacco) complicating pregnancy, third trimester: Secondary | ICD-10-CM | POA: Diagnosis not present

## 2020-05-07 DIAGNOSIS — E0590 Thyrotoxicosis, unspecified without thyrotoxic crisis or storm: Secondary | ICD-10-CM

## 2020-05-07 DIAGNOSIS — F17210 Nicotine dependence, cigarettes, uncomplicated: Secondary | ICD-10-CM

## 2020-05-07 DIAGNOSIS — D696 Thrombocytopenia, unspecified: Secondary | ICD-10-CM

## 2020-05-07 DIAGNOSIS — O99810 Abnormal glucose complicating pregnancy: Secondary | ICD-10-CM

## 2020-05-07 DIAGNOSIS — O99331 Smoking (tobacco) complicating pregnancy, first trimester: Secondary | ICD-10-CM | POA: Diagnosis not present

## 2020-05-07 DIAGNOSIS — O1213 Gestational proteinuria, third trimester: Secondary | ICD-10-CM

## 2020-05-07 DIAGNOSIS — E669 Obesity, unspecified: Secondary | ICD-10-CM

## 2020-05-07 DIAGNOSIS — O09523 Supervision of elderly multigravida, third trimester: Secondary | ICD-10-CM

## 2020-05-07 DIAGNOSIS — O99330 Smoking (tobacco) complicating pregnancy, unspecified trimester: Secondary | ICD-10-CM

## 2020-05-07 DIAGNOSIS — O288 Other abnormal findings on antenatal screening of mother: Secondary | ICD-10-CM | POA: Diagnosis not present

## 2020-05-07 DIAGNOSIS — O0933 Supervision of pregnancy with insufficient antenatal care, third trimester: Secondary | ICD-10-CM | POA: Insufficient documentation

## 2020-05-07 DIAGNOSIS — O321XX0 Maternal care for breech presentation, not applicable or unspecified: Secondary | ICD-10-CM

## 2020-05-07 DIAGNOSIS — O99283 Endocrine, nutritional and metabolic diseases complicating pregnancy, third trimester: Secondary | ICD-10-CM

## 2020-05-07 NOTE — Progress Notes (Signed)
MFM Consultation  Date of Service:05/07/2020 Reason for request: abnormal thyroid function test Requesting provider: Arnetha Courser, CNM  Cynthia Mckay is a 40 yo G8 P5 who is here at 28w 2 d with an EDD of 07/28/2020 in consultation regarding abnormal thyroid function test. She is being seen today at the request of Ms. Sciora CNM.  Cynthia Mckay is late to care as she presented at 74 w 6 d. Quad screen negative. She had an abnormal 1hr GTT , her UPC was 311 mg/dL but has normal creatinine and blood pressure. Her hgbA1c is normal at 4.7%. Her platelets have 135 and 125 as of 05/02/20.  Her thyroid test demonstrated TSH 0.430 reference range of 0.450 FT4 is normal T3 is 226 with reference range of 71-180 ng/dL.     Vitals with BMI 05/07/2020 05/02/2020 04/02/2020  Height 5\' 6"  - -  Weight 259 lbs 8 oz 259 lbs 10 oz 258 lbs 13 oz  BMI 41.9 - -  Systolic 120 106 161  Diastolic 71 62 59  Pulse 107 95 108   CBC Latest Ref Rng & Units 05/02/2020 04/02/2020 10/26/2016  WBC 3.4 - 10.8 x10E3/uL 6.0 8.9 8.8  Hemoglobin 11.1 - 15.9 g/dL 0.9(U) 10.1(L) 13.1  Hematocrit 34.0 - 46.6 % 28.6(L) 29.2(L) 37.7  Platelets 150 - 450 x10E3/uL 125(L) 135(L) 167   CMP Latest Ref Rng & Units 04/02/2020 10/26/2016 09/19/2015  Glucose 65 - 99 mg/dL 92 045(W) 95  BUN 6 - 20 mg/dL 6 9 11   Creatinine 0.57 - 1.00 mg/dL 0.98(J) 1.91 4.78(G)  Sodium 134 - 144 mmol/L 138 139 136  Potassium 3.5 - 5.2 mmol/L 4.2 4.0 3.7  Chloride 96 - 106 mmol/L 104 105 106  CO2 20 - 29 mmol/L 22 26 24   Calcium 8.7 - 10.2 mg/dL 9.1 9.1 8.9  Total Protein 6.0 - 8.5 g/dL 6.8 7.9 -  Total Bilirubin 0.0 - 1.2 mg/dL 0.3 0.5 -  Alkaline Phos 44 - 121 IU/L 98 90 -  AST 0 - 40 IU/L 17 69(H) -  ALT 0 - 32 IU/L 14 89(H) -   OB History  Gravida Para Term Preterm AB Living  8 5 5  0 2 5  SAB IAB Ectopic Multiple Live Births  2 0 0 0 5    # Outcome Date GA Lbr Len/2nd Weight Sex Delivery Anes PTL Lv  8 Current           7 Term 11/01/14 [redacted]w[redacted]d 10:55  / 00:19 3629 g F Vag-Spont EPI  LIV     Birth Comments: MSAF, thick     Name: Bayard Beaver     Apgar1: 8  Apgar5: 9  6 SAB 2014          5 Term 04/23/11   4082 g F      4 Term 02/18/10    M      3 Term 01/24/09   4167 g M      2 Term 06/15/06    M      1 SAB 2007           Past Medical History:  Diagnosis Date  . Anemia    Past Surgical History:  Procedure Laterality Date  . GALLBLADDER SURGERY     Family History  Problem Relation Age of Onset  . Hypertension Mother   . Hypertension Father   . Depression Brother   . Renal Disease Paternal Grandmother   . Breast cancer Maternal Aunt  Social History   Socioeconomic History  . Marital status: Single    Spouse name: Not on file  . Number of children: 5  . Years of education: 18  . Highest education level: High school graduate  Occupational History  . Not on file  Tobacco Use  . Smoking status: Former Smoker    Packs/day: 0.50    Types: Cigarettes  . Smokeless tobacco: Never Used  . Tobacco comment: No secondhand smoke exposure  Vaping Use  . Vaping Use: Never used  Substance and Sexual Activity  . Alcohol use: Not Currently    Alcohol/week: 1.0 standard drink    Types: 1 Glasses of wine per week    Comment: last use- 2 mo ago  . Drug use: No  . Sexual activity: Yes  Other Topics Concern  . Not on file  Social History Narrative  . Not on file   Social Determinants of Health   Financial Resource Strain: Not on file  Food Insecurity: No Food Insecurity  . Worried About Programme researcher, broadcasting/film/video in the Last Year: Never true  . Ran Out of Food in the Last Year: Never true  Transportation Needs: No Transportation Needs  . Lack of Transportation (Medical): No  . Lack of Transportation (Non-Medical): No  Physical Activity: Not on file  Stress: Not on file  Social Connections: Not on file  Intimate Partner Violence: Not At Risk  . Fear of Current or Ex-Partner: No  . Emotionally Abused: No  . Physically  Abused: No  . Sexually Abused: No   Current Outpatient Medications  Medication Instructions  . acetaminophen-codeine (TYLENOL #3) 300-30 MG tablet 1 tablet, Oral, Every 8 hours PRN  . ferrous fumarate (HEMOCYTE - 106 MG FE) 325 (106 FE) MG TABS tablet 1 tablet, Oral  . Iron, Ferrous Sulfate, 325 (65 Fe) MG TABS 1 tablet, Oral, Daily  . Prenatal Vit-Fe Fumarate-FA (MULTIVITAMIN-PRENATAL) 27-0.8 MG TABS tablet 1 tablet, Oral, Daily  . Prenatal Vit-Fe Fumarate-FA (PRENATAL MULTIVITAMIN) TABS tablet 1 tablet, Oral, Daily  . vitamin C 1,000 mg, Oral, Daily   Imaging: Single intrauterine pregnancy with measurements consistent with dates. Normal anatomy however, suboptimal views of the fetal anatomy was obtained secondary to fetal position, maternal habitus, and advanced gestational age.   There is good fetal movement and amniotic fluid volume   Impression/Counseling:  1) Abnormal TFT's- Cynthia Mckay has no signs or symptoms of overt hyperthyroidism. Her labs are just near normal limits with the TSH slightly lower than normal and T3 slightly higher but not > 1.5 x normal. Which for T3 this may be a pregnancy related increased due elevated circulating TBG. She has been referred to an endocrinologist for further evaluation. At best she has subclinical hyperthyroidism which is not treated in pregnancy. I suggest at this time repeat labs in 1 weeks since that were drawn 3 weeks ago.  2) Gestational thrombocytopenia: Her platelets are < 150 mm. I explained that this is likely gestational thrombocytopenia, verses HELLP syndrome or preeclampsia. She does have elevated UPC at 311 but normal blood pressure. She is without s/sx of preeclampsia. Repeating CBC monthly. If her platelets drop below 70-80 she will not be a candidate for regional anesthesia. Consider anesthesia appointment in the third trimester and an hematology referral if her platelets drop below 70.  3) BMI >40 We discussed nutritional and  exercise habits to reach to recommended weight gain between 11-20 lb. She is aware and conveyed that she will began making  these adjustments.  4) Failed 1hr GTT she is scheduled to take her 3 hr GTT.   5) Smoking- she plans to stop smoking she is down to 2-3 cigarettes per day.  Recommendations:  1) Follow up growth at 32 and 36 weeks 2) Initiate weekly testing at 36 weeks given elevated BMI 3) Follow up with 3hr GTT given elevated 1hr GTT. 4) Repeat TFT's in 1 weeks, follow up on endocrine referral and recommendations 5) Monthly CBC, if Platelets drop below 90 consult MFM and consult hematology.  6)  Anesthesia consultation at 32-34 weeks.   I spent 45 minutes with > 50% in face to face consultation All questions answered.

## 2020-05-07 NOTE — Plan of Care (Signed)
Cynthia Mckay, [facility] intake clerk, presented to clinic at ~ 1045 stating client had arrived for her 3 hour GTT. Requested she reschedule appt due to length of time will be NPO and availability of lab to draw all of the specimens. Appt rescheduled for 05/15/2020. Immediately after talking with Ms. Harris, received call from [name], [facility] chart room clerk, as client on phone requesting copy of Covid positive test results. Encouraged to obtain results from [facility]. RN then received call from R. Marlan Palau, MSW/OBCM with client on phone requesting same test results and encouraged call to [facility] (number provided) to ascertain if they could provide her quick copy of test results. Jossie Ng, RN

## 2020-05-08 ENCOUNTER — Telehealth: Payer: Self-pay

## 2020-05-08 NOTE — Telephone Encounter (Signed)
Return call from client and counseled to call [facility] as endocrinology appt now available. [facility] number provided. Jossie Ng, RN

## 2020-05-08 NOTE — Addendum Note (Signed)
Addended by: Heywood Bene on: 05/08/2020 10:32 AM   Modules accepted: Orders

## 2020-05-08 NOTE — Telephone Encounter (Signed)
Call to [facility] to ascertain if Endocrinology appt has been scheduled. Per receptionist, message was left for her yesterday requesting a call back for appt. RN called client this am to request she call [facility] for appt. Per recorded message on mobile phone, voicemail is not set up. Call to land line phone and left message to call regarding endocrinology appt. Jossie Ng, RN

## 2020-05-10 ENCOUNTER — Telehealth: Payer: Self-pay

## 2020-05-10 NOTE — Telephone Encounter (Signed)
Call to [facility] Endocrinology receptionist who reports client returned call and notified of appt scheduled for 06/18/2020 at 2:45 pm. Jossie Ng, RN

## 2020-05-15 ENCOUNTER — Other Ambulatory Visit: Payer: Medicaid Other

## 2020-05-15 ENCOUNTER — Telehealth: Payer: Self-pay

## 2020-05-15 NOTE — Telephone Encounter (Signed)
[facility] 05/15/2020 for 3 hour GTT and thyroid function test. Call to client at mobile number and female answered call. RN identified self and call was disconnected. Return call to same number and per recorded message, voicemail has no been set up. Call to home number and left message to call requesting 3 hour GTT be rescheduled and number to call provided. Jossie Ng, RN

## 2020-05-16 ENCOUNTER — Encounter: Payer: Self-pay | Admitting: Family Medicine

## 2020-05-16 ENCOUNTER — Ambulatory Visit: Payer: Medicaid Other

## 2020-05-16 NOTE — Telephone Encounter (Signed)
Client [facility] for [facility] RV appt 05/16/2020. Call to mobile number listed in demographic screen and per female, this is not number for client. Call to home number and left message to call regarding 3 hour GTT appt and rescheduling maternity clinic appt. Number to call provided. Call to emergency contact with [facility] [employer] ID # [id number] as unknown if mother is an [place] speaker. Per interpreter, recorded message states number is not available and no voicemail set up.

## 2020-05-16 NOTE — Telephone Encounter (Signed)
Call to pt. Unable to leave message (about appt today and to counsel about 3 hour GTT).

## 2020-05-16 NOTE — Progress Notes (Signed)
[facility] Department Maternity Care Conference  Maternity Care Conference Date: 05/16/20  Cynthia Mckay was identified by clinical staff to benefit from an interdisciplinary team approach to help improve pregnancy care.  The [facility] Maternity Care Conference includes the maternity clinic coordinator (RN), medical providers (MD/APP staff), Care Management -OBCM and Healthy Beginnings, Centering Pregnancy coordinator, Infant Mortality reduction Dietitian.  Nursing staff are also encouraged to participate. The group meets monthly to discuss patient care and coordinate services.   The patient's care care at the agency was reviewed in EMR and high risk factors evaluated in an interdisciplinary approach.    Value added interventions discussed at this care conference today were: Patient is open to Ellison Carwin, MSW Care Manager. Arlana Hove will follow up with patient regarding her considering transferring her car. Patient needs lab appointment rescheduled.

## 2020-05-17 NOTE — Telephone Encounter (Signed)
Phone call to pt at [phone]. Left message on voicemail that RN is calling to schedule some appts.  Please call us back at [phone].  Phone call pt contact listed at [phone]. Not available, try again later. Unable to leave message.

## 2020-05-20 ENCOUNTER — Telehealth: Payer: Self-pay | Admitting: Family Medicine

## 2020-05-20 NOTE — Telephone Encounter (Signed)
Received a phone message from clerical (under different encounter) that pt called and said she missed a call from [facility]:  Phone call to pt at [phone]. Pt states she has had a lot going on and has appt after appt. Sounded like pt stated she has had some issues with transportation, but when RN went to tried to confirm this information, no response, called pt's name several times with no response.  Phone call to pt again. Received message "Not available; left message on voicemail that RN is calling about labs and provider appt, wondering if she stated she was having transportation issues.  Please call us back about appts and let us know how we can help at [phone].

## 2020-05-20 NOTE — Telephone Encounter (Signed)
Pt returned a missed call from nurse.

## 2020-05-20 NOTE — Telephone Encounter (Addendum)
Pt states she has transportation issues, car is in shop and will probably be there for a while.  Pt already aware of thyroid specialist appt on 06/18/20, [place].  Counseled pt that we can try to set up an appt to get everything taken care of at same appt time (provider appt, 3 hr gtt, thyroid labs - would need to be early morning).  Pt confirms that any day is good for her, she does not work and kids are in school, no conflicts with other appts; just needs help with transportation to be able to get here.  Pt would like any assistance [facility] is able to offer with transportation.   RN placed call and left message with Jennette Dubin.

## 2020-05-20 NOTE — Telephone Encounter (Signed)
Phone call to pt at [phone]. Pt states she has had a lot going on and has appt after appt. Sounded like pt stated she has had some issues with transportation, but when RN went to tried to confirm this information, no response, called pt's name several times with no response.  Phone call to pt again. Received message "Not available; left message on voicemail that RN is calling about labs and provider appt, wondering if she stated she was having transportation issues.  Please call us back about appts and let us know how we can help at [phone].  **See previous phone note/encounter started.**

## 2020-05-20 NOTE — Telephone Encounter (Signed)
Phone call to pt at [phone]. Left message on voicemail that RN with [facility] is calling to schedule some appts.  Please call us back at [phone].

## 2020-05-25 ENCOUNTER — Other Ambulatory Visit: Payer: Self-pay | Admitting: Advanced Practice Midwife

## 2020-05-25 DIAGNOSIS — O0933 Supervision of pregnancy with insufficient antenatal care, third trimester: Secondary | ICD-10-CM

## 2020-05-25 DIAGNOSIS — O99213 Obesity complicating pregnancy, third trimester: Secondary | ICD-10-CM

## 2020-05-25 DIAGNOSIS — O99333 Smoking (tobacco) complicating pregnancy, third trimester: Secondary | ICD-10-CM

## 2020-05-25 DIAGNOSIS — O99113 Other diseases of the blood and blood-forming organs and certain disorders involving the immune mechanism complicating pregnancy, third trimester: Secondary | ICD-10-CM

## 2020-05-25 DIAGNOSIS — D693 Immune thrombocytopenic purpura: Secondary | ICD-10-CM

## 2020-05-25 DIAGNOSIS — O1210 Gestational proteinuria, unspecified trimester: Secondary | ICD-10-CM

## 2020-05-25 DIAGNOSIS — O09523 Supervision of elderly multigravida, third trimester: Secondary | ICD-10-CM

## 2020-05-28 NOTE — Telephone Encounter (Signed)
See phone note started 05/15/20.

## 2020-05-28 NOTE — Telephone Encounter (Signed)
Ellison Carwin was able to arrange transportation and confirmed with pt that 05/31/20 appt will work.  Phone call to pt. Pt confirmed her appt for this Friday, 05/31/20. Pt given instructions for 3 hr gtt lab that day. Pt to have labs done and see provider.

## 2020-05-28 NOTE — Telephone Encounter (Signed)
Received phone call from R.Marlan Palau. Pt scheduled for 05/31/20 and trying to make transportation arrangements; will confirm if 05/31/20 once discussed with transportation and pt.

## 2020-05-28 NOTE — Telephone Encounter (Signed)
RN left message for Cynthia Mckay for follow-up, inquiring if we can help pt with transportation issues or if someone else has been assigned to this pt.

## 2020-05-31 ENCOUNTER — Ambulatory Visit: Payer: Medicaid Other | Admitting: Family Medicine

## 2020-05-31 ENCOUNTER — Other Ambulatory Visit: Payer: Self-pay

## 2020-05-31 VITALS — BP 113/71 | HR 94 | Temp 97.7°F | Wt 267.2 lb

## 2020-05-31 DIAGNOSIS — R799 Abnormal finding of blood chemistry, unspecified: Secondary | ICD-10-CM

## 2020-05-31 DIAGNOSIS — O0992 Supervision of high risk pregnancy, unspecified, second trimester: Secondary | ICD-10-CM

## 2020-05-31 DIAGNOSIS — O99212 Obesity complicating pregnancy, second trimester: Secondary | ICD-10-CM

## 2020-05-31 DIAGNOSIS — O99012 Anemia complicating pregnancy, second trimester: Secondary | ICD-10-CM

## 2020-05-31 LAB — HEMOGLOBIN, FINGERSTICK: Hemoglobin: 9.6 g/dL — ABNORMAL LOW (ref 11.1–15.9)

## 2020-05-31 LAB — URINALYSIS
Bilirubin, UA: NEGATIVE
Glucose, UA: NEGATIVE
Ketones, UA: NEGATIVE
Leukocytes,UA: NEGATIVE
Nitrite, UA: NEGATIVE
Specific Gravity, UA: 1.03 (ref 1.005–1.030)
Urobilinogen, Ur: 1 mg/dL (ref 0.2–1.0)
pH, UA: 6 (ref 5.0–7.5)

## 2020-05-31 MED ORDER — PREPLUS 27-1 MG PO TABS: 1.0000 | ORAL_TABLET | Freq: Every day | ORAL | 3 refills | Status: DC

## 2020-05-31 NOTE — Progress Notes (Signed)
Patient here for MH RV and 3 hour gtt at 31 5/7. Patient states last time she ate or drank was at 8:30pm last night. Sent to lab and told to ring the bell in [facility] clinic for provider visit.Burt Knack, RN

## 2020-05-31 NOTE — Progress Notes (Signed)
Patient signed BTL consent today, copy given to patient and to R. Marlan Palau, copy sent for scanning. Patient given BTL information pamphlet and was able to connect with R. Marlan Palau face to face today. Urine dip and Hgb reviewed with provider. Patient confirmed that she is taking her iron twice a day with juice, spread out by a couple of hours. Patient counseled on other ways to increase her iron intake and referred to anemia pamphlet given at previous appt. Needs Hgb recheck in 1 month...Marland KitchenBurt Knack, RN

## 2020-06-01 LAB — FE+CBC/D/PLT+TIBC+FER+RETIC
Basophils Absolute: 0 10*3/uL (ref 0.0–0.2)
Basos: 0 %
EOS (ABSOLUTE): 0.2 10*3/uL (ref 0.0–0.4)
Eos: 2 %
Ferritin: 72 ng/mL (ref 15–150)
Hematocrit: 27.3 % — ABNORMAL LOW (ref 34.0–46.6)
Hemoglobin: 9.5 g/dL — ABNORMAL LOW (ref 11.1–15.9)
Immature Grans (Abs): 0.2 10*3/uL — ABNORMAL HIGH (ref 0.0–0.1)
Immature Granulocytes: 2 %
Iron Saturation: 26 % (ref 15–55)
Iron: 88 ug/dL (ref 27–159)
Lymphocytes Absolute: 1.6 10*3/uL (ref 0.7–3.1)
Lymphs: 15 %
MCH: 31.3 pg (ref 26.6–33.0)
MCHC: 34.8 g/dL (ref 31.5–35.7)
MCV: 90 fL (ref 79–97)
Monocytes Absolute: 0.5 10*3/uL (ref 0.1–0.9)
Monocytes: 5 %
Neutrophils Absolute: 8 10*3/uL — ABNORMAL HIGH (ref 1.4–7.0)
Neutrophils: 76 %
Platelets: 137 10*3/uL — ABNORMAL LOW (ref 150–450)
RBC: 3.04 x10E6/uL — ABNORMAL LOW (ref 3.77–5.28)
RDW: 13.6 % (ref 11.7–15.4)
Retic Ct Pct: 4.9 % — ABNORMAL HIGH (ref 0.6–2.6)
Total Iron Binding Capacity: 334 ug/dL (ref 250–450)
UIBC: 246 ug/dL (ref 131–425)
WBC: 10.5 10*3/uL (ref 3.4–10.8)

## 2020-06-01 LAB — THYROID PANEL WITH TSH
Free Thyroxine Index: 1.8 (ref 1.2–4.9)
T3 Uptake Ratio: 18 % — ABNORMAL LOW (ref 24–39)
T4, Total: 9.9 ug/dL (ref 4.5–12.0)
TSH: 0.297 u[IU]/mL — ABNORMAL LOW (ref 0.450–4.500)

## 2020-06-01 LAB — CBC WITH DIFFERENTIAL/PLATELET
Basophils Absolute: 0 10*3/uL (ref 0.0–0.2)
Basos: 0 %
EOS (ABSOLUTE): 0.2 10*3/uL (ref 0.0–0.4)
Eos: 2 %
Hematocrit: 29 % — ABNORMAL LOW (ref 34.0–46.6)
Hemoglobin: 10.1 g/dL — ABNORMAL LOW (ref 11.1–15.9)
Immature Grans (Abs): 0.2 10*3/uL — ABNORMAL HIGH (ref 0.0–0.1)
Immature Granulocytes: 2 %
Lymphocytes Absolute: 1.3 10*3/uL (ref 0.7–3.1)
Lymphs: 14 %
MCH: 32.2 pg (ref 26.6–33.0)
MCHC: 34.8 g/dL (ref 31.5–35.7)
MCV: 92 fL (ref 79–97)
Monocytes Absolute: 0.3 10*3/uL (ref 0.1–0.9)
Monocytes: 3 %
Neutrophils Absolute: 7.7 10*3/uL — ABNORMAL HIGH (ref 1.4–7.0)
Neutrophils: 79 %
Platelets: 134 10*3/uL — ABNORMAL LOW (ref 150–450)
RBC: 3.14 x10E6/uL — ABNORMAL LOW (ref 3.77–5.28)
RDW: 13.7 % (ref 11.7–15.4)
WBC: 9.8 10*3/uL (ref 3.4–10.8)

## 2020-06-01 LAB — GLUCOSE TOLERANCE TEST, 6 HOUR
Glucose, 1 Hour GTT: 196 mg/dL (ref 65–199)
Glucose, 2 hour: 161 mg/dL — ABNORMAL HIGH (ref 65–139)
Glucose, 3 hour: 93 mg/dL (ref 65–109)
Glucose, GTT - Fasting: 96 mg/dL (ref 65–99)

## 2020-06-01 LAB — PROTEIN / CREATININE RATIO, URINE
Creatinine, Urine: 121.5 mg/dL
Protein, Ur: 23.1 mg/dL
Protein/Creat Ratio: 190 mg/g creat (ref 0–200)

## 2020-06-02 ENCOUNTER — Other Ambulatory Visit: Payer: Self-pay | Admitting: Family Medicine

## 2020-06-02 DIAGNOSIS — O24419 Gestational diabetes mellitus in pregnancy, unspecified control: Secondary | ICD-10-CM

## 2020-06-02 DIAGNOSIS — O99210 Obesity complicating pregnancy, unspecified trimester: Secondary | ICD-10-CM

## 2020-06-03 ENCOUNTER — Encounter: Payer: Self-pay | Admitting: Family Medicine

## 2020-06-03 NOTE — Progress Notes (Signed)
[facility] [place] referral received from  Elveria Rising FNP-BC and faxed with confirmation received. As prior USs this pregnancy at [facility], RN will call when clinic opens 06/04/2020 to schedule appt. Jossie Ng, RN
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
## 2020-06-04 ENCOUNTER — Telehealth: Payer: Self-pay

## 2020-06-04 ENCOUNTER — Ambulatory Visit: Payer: Medicaid Other

## 2020-06-04 NOTE — Telephone Encounter (Signed)
Spoke with Ms. Marlan Palau via phone call today regarding client not keeping [facility] appt. Per Ms. Marlan Palau, client does have transportation via her managed care plan which requires 2 days advance notice. ([facility] does not provide child care for clients).Jossie Ng, RN

## 2020-06-04 NOTE — Progress Notes (Deleted)
Pt was a "No Show" for her MFM appointment today @ [facility]

## 2020-06-04 NOTE — Telephone Encounter (Signed)
Call to [facility] to schedule 36 week growth scan for client (referral faxed yesterday, but had to wait until clinic open today to schedule). Per Toni Amend, client [facility] this am for 32 week [place] and appt was verbally confirmed with client via phone call. Per Toni Amend, due to lack of available appts, unlikely will be able to reschedule 32 week [place] appt. Toni Amend stated first available appt was the end of April. Courtney aware 36 week [place] appt needed and states will send writer appt when scheduled. Jossie Ng, RN

## 2020-06-06 ENCOUNTER — Telehealth: Payer: Self-pay

## 2020-06-06 NOTE — Telephone Encounter (Signed)
Staff message from Roxy Horseman RN at [facility] M[facility] received stating 36 week [place] scheduled for 07/02/2020 at 1 pm and needs to arrive by 12:45 pm. (32 week [place] client missed 06/04/2020 because scheduled transportaiton did not arrive unable to be scheduled due to no available appt). Call to client and notified her of appt. Client reminded of next [facility] RV appt and 06/18/20 [facility] endocrinology appt. Counseled that has GDM and referral has been made to [facility] who will call her with appt. Jossie Ng, RN

## 2020-06-06 NOTE — Progress Notes (Addendum)
Received [facility] referral from Elveria Rising FNP-BC this am. Referral, snapshot pages, one hour glucola results x2 and 3 hour GTT results faxed with confirmation received. Client aware [facility] will call her with appt. Jossie Ng, RN  Received call from [place] at [facility] who states must have electronic referral for client (not paper referral previosuly sent today). Elveria Rising FNP-BC aware. Jossie Ng, RN Per Epic appt desk, client scheduled for [facility] GDM class 06/11/2020 at 1315. Jossie Ng, RN

## 2020-06-07 MED ORDER — BLOOD GLUCOSE MONITOR KIT
PACK | 0 refills | Status: DC
Start: 2020-06-07 — End: 2020-06-29

## 2020-06-11 ENCOUNTER — Encounter: Payer: Self-pay | Admitting: *Deleted

## 2020-06-11 ENCOUNTER — Encounter: Payer: Medicaid Other | Attending: Family Medicine | Admitting: *Deleted

## 2020-06-11 ENCOUNTER — Other Ambulatory Visit: Payer: Self-pay

## 2020-06-11 VITALS — BP 112/64 | Ht 67.0 in | Wt 268.1 lb

## 2020-06-11 DIAGNOSIS — O24410 Gestational diabetes mellitus in pregnancy, diet controlled: Secondary | ICD-10-CM

## 2020-06-11 DIAGNOSIS — O24419 Gestational diabetes mellitus in pregnancy, unspecified control: Secondary | ICD-10-CM | POA: Diagnosis not present

## 2020-06-11 DIAGNOSIS — Z3A00 Weeks of gestation of pregnancy not specified: Secondary | ICD-10-CM | POA: Diagnosis not present

## 2020-06-11 NOTE — Patient Instructions (Signed)
Read booklet on Gestational Diabetes Follow Gestational Meal Planning Guidelines Drink more water Avoid sugar sweetened sodas and coffee Limit fruit juices Complete a 3 Day Food Record and bring to next appointment Check blood sugars 4 x day - before breakfast and 2 hrs after every meal and record  Bring blood sugar log to all appointments Call MD for prescription for meter strips and lancets Strips   Accu-Chek Guide Lancets   Accu-Chek Softclix Purchase urine ketone strips if instructed by MD and check urine ketones every am:  If + increase bedtime snack to 1 protein and 2 carbohydrate servings Walk 20-30 minutes at least 5 x week if permitted by MD

## 2020-06-11 NOTE — Progress Notes (Signed)
Diabetes Self-Management Education  Visit Type: First/Initial  Appt. Start Time: 1320 Appt. End Time: 1445  06/11/2020  [name], identified by name and date of birth, is a 40 y.o. female with a diagnosis of Diabetes: Gestational Diabetes.   ASSESSMENT  Blood pressure 112/64, height 5\' 7"  (1.702 m), weight 268 lb 1.6 oz (121.6 kg), last menstrual period 11/08/2019, estimated date of delivery 07/28/2020 Body mass index is 41.99 kg/m.   Diabetes Self-Management Education - 06/11/20 1540      Visit Information   Visit Type First/Initial      Initial Visit   Diabetes Type Gestational Diabetes    Are you currently following a meal plan? No    Are you taking your medications as prescribed? Yes    Date Diagnosed last week      Health Coping   How would you rate your overall health? Fair      Psychosocial Assessment   Patient Belief/Attitude about Diabetes Other (comment)   "worried"   Self-care barriers None    Self-management support Doctor's office;Friends    Other persons present Spouse/SO    Patient Concerns Nutrition/Meal planning;Glycemic Control;Weight Control    Special Needs None    Preferred Learning Style Visual;Auditory    Learning Readiness Ready    How often do you need to have someone help you when you read instructions, pamphlets, or other written materials from your doctor or pharmacy? 1 - Never    What is the last grade level you completed in school? high school      Pre-Education Assessment   Patient understands the diabetes disease and treatment process. Needs Instruction    Patient understands incorporating nutritional management into lifestyle. Needs Instruction    Patient undertands incorporating physical activity into lifestyle. Needs Instruction    Patient understands using medications safely. Needs Instruction    Patient understands monitoring blood glucose, interpreting and using results Needs Instruction    Patient understands  prevention, detection, and treatment of acute complications. Needs Instruction    Patient understands prevention, detection, and treatment of chronic complications. Needs Instruction    Patient understands how to develop strategies to address psychosocial issues. Needs Instruction    Patient understands how to develop strategies to promote health/change behavior. Needs Instruction      Complications   Last HgB A1C per patient/outside source 4.7 %   04/02/2020   How often do you check your blood sugar? 0 times/day (not testing)   Provided Accu-Chek Guide Me meter and instructed on use. BG upon return demonstration was 85 mg/dL at 04/04/2020 pm - 2 hrs pp.   Have you had a dilated eye exam in the past 12 months? Yes    Have you had a dental exam in the past 12 months? Yes    Are you checking your feet? No      Dietary Intake   Breakfast eggs, bacon or sausage, toast, hashbrown    Lunch ham and cheese sandwich, sometimes chips    Snack (afternoon) cottage cheese, nuts, fruit (banana, pineapple, peach) cereal    Dinner chicken, beef, pork, fish; potatoes, peas, beans, corn, rice, pasta, tortilla, lettuce, tomatoes, cuccumbers, broccoli, cauliflower, squash, onions, peppers    Beverage(s) fruit juice, gingerale, coffee, little water      Exercise   Exercise Type ADL's      Patient Education   Previous Diabetes Education No    Disease state  Definition of diabetes, type 1 and 2, and the diagnosis  of diabetes;Factors that contribute to the development of diabetes    Nutrition management  Role of diet in the treatment of diabetes and the relationship between the three main macronutrients and blood glucose level;Food label reading, portion sizes and measuring food.;Reviewed blood glucose goals for pre and post meals and how to evaluate the patients' food intake on their blood glucose level.    Physical activity and exercise  Role of exercise on diabetes management, blood pressure control and cardiac  health.    Medications Other (comment)   Limited use of oral medications during pregnancy and potential for insulin.   Monitoring Taught/evaluated SMBG meter.;Purpose and frequency of SMBG.;Taught/discussed recording of test results and interpretation of SMBG.;Identified appropriate SMBG and/or A1C goals.;Ketone testing, when, how.    Chronic complications Relationship between chronic complications and blood glucose control    Psychosocial adjustment Identified and addressed patients feelings and concerns about diabetes    Preconception care Pregnancy and GDM  Role of pre-pregnancy blood glucose control on the development of the fetus;Reviewed with patient blood glucose goals with pregnancy;Role of family planning for patients with diabetes      Individualized Goals (developed by patient)   Reducing Risk Other (comment)   improve blood sugars, lose weight     Outcomes   Expected Outcomes Demonstrated interest in learning. Expect positive outcomes           Individualized Plan for Diabetes Self-Management Training:   Learning Objective:  Patient will have a greater understanding of diabetes self-management. Patient education plan is to attend individual and/or group sessions per assessed needs and concerns.   Plan:   Patient Instructions  Read booklet on Gestational Diabetes Follow Gestational Meal Planning Guidelines Drink more water Avoid sugar sweetened sodas and coffee Limit fruit juices Complete a 3 Day Food Record and bring to next appointment Check blood sugars 4 x day - before breakfast and 2 hrs after every meal and record  Bring blood sugar log to all appointments Call MD for prescription for meter strips and lancets Strips   Accu-Chek Guide Lancets   Accu-Chek Softclix Purchase urine ketone strips if instructed by MD and check urine ketones every am:  If + increase bedtime snack to 1 protein and 2 carbohydrate servings Walk 20-30 minutes at least 5 x week if permitted by  MD  Expected Outcomes:  Demonstrated interest in learning. Expect positive outcomes  Education material provided:  Gestational Booklet Gestational Meal Planning Guidelines Simple Meal Plan Meter = Accu-Chek Guide Me 3 Day Food Record Goals for a Healthy Pregnancy  If problems or questions, patient to contact team via:  Sharion Settler, RN, CCM, CDCES [phone]  Future DSME appointment:  June 19, 2020 with the dietitian

## 2020-06-12 ENCOUNTER — Telehealth: Payer: Self-pay | Admitting: Family Medicine

## 2020-06-12 NOTE — Telephone Encounter (Signed)
Client asking if vitamins she bought that say MVI for pregnant and nursing mothers is okay to take. Counseled that as bottle indicates for pregnancy, should be fine. Client also reports purchased iron tablets (325 mg, 65 iron) as ran out of supply. Counseled that same dosage we dispense at [facility]. RN made call to [facility] and per pharmacy, prescription ready and client needs to bring prescription card. Cynthia Mckay aware and states will pick up supplies later today. Jossie Ng, RN

## 2020-06-12 NOTE — Telephone Encounter (Signed)
Pt called wanting to speak with nurse about her prenatal vitamins and details from her last lab. Was also calling because she is having issues picking up her medication at the pharmacy, says the pharmacy hasn't received prescription.

## 2020-06-14 ENCOUNTER — Other Ambulatory Visit: Payer: Self-pay

## 2020-06-14 ENCOUNTER — Ambulatory Visit: Payer: Medicaid Other | Admitting: Advanced Practice Midwife

## 2020-06-14 VITALS — BP 113/66 | HR 99 | Temp 97.8°F | Wt 264.0 lb

## 2020-06-14 DIAGNOSIS — O0992 Supervision of high risk pregnancy, unspecified, second trimester: Secondary | ICD-10-CM

## 2020-06-14 DIAGNOSIS — O99210 Obesity complicating pregnancy, unspecified trimester: Secondary | ICD-10-CM

## 2020-06-14 DIAGNOSIS — Z641 Problems related to multiparity: Secondary | ICD-10-CM

## 2020-06-14 DIAGNOSIS — O24419 Gestational diabetes mellitus in pregnancy, unspecified control: Secondary | ICD-10-CM

## 2020-06-14 DIAGNOSIS — D696 Thrombocytopenia, unspecified: Secondary | ICD-10-CM

## 2020-06-14 DIAGNOSIS — O2200 Varicose veins of lower extremity in pregnancy, unspecified trimester: Secondary | ICD-10-CM

## 2020-06-14 DIAGNOSIS — E0590 Thyrotoxicosis, unspecified without thyrotoxic crisis or storm: Secondary | ICD-10-CM

## 2020-06-14 DIAGNOSIS — O99119 Other diseases of the blood and blood-forming organs and certain disorders involving the immune mechanism complicating pregnancy, unspecified trimester: Secondary | ICD-10-CM

## 2020-06-14 DIAGNOSIS — O99012 Anemia complicating pregnancy, second trimester: Secondary | ICD-10-CM

## 2020-06-14 DIAGNOSIS — O09529 Supervision of elderly multigravida, unspecified trimester: Secondary | ICD-10-CM | POA: Insufficient documentation

## 2020-06-14 LAB — URINALYSIS
Bilirubin, UA: POSITIVE — AB
Glucose, UA: NEGATIVE
Leukocytes,UA: NEGATIVE
Nitrite, UA: NEGATIVE
Specific Gravity, UA: 1.03 (ref 1.005–1.030)
Urobilinogen, Ur: 1 mg/dL (ref 0.2–1.0)
pH, UA: 6 (ref 5.0–7.5)

## 2020-06-14 MED ORDER — EZ SMART BLOOD GLUCOSE LANCETS MISC: 12 refills | Status: DC

## 2020-06-14 MED ORDER — BLOOD GLUCOSE TEST VI STRP: 1.0000 [IU] | ORAL_STRIP | Freq: Four times a day (QID) | 12 refills | Status: DC
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
## 2020-06-14 NOTE — Progress Notes (Signed)
Urine dip reviewed with provider, spot protein/creatinine and urine culture ordered per provider orders. Patient scheduled for 1 week revisit on 06/20/2020. Patient had to be overbooked due to full schedule and holiday on 06/21/2020. Patient aware of situation. Patient needs more strips and lancets, ordered by provider.Burt Knack, RN

## 2020-06-14 NOTE — Progress Notes (Signed)
Pt denies visits to ER since Pt states she is already doing kick counts. Taking PNV and iron (BID iron). Copied pt's blood sugar log.

## 2020-06-15 LAB — PROTEIN / CREATININE RATIO, URINE
Creatinine, Urine: 149.4 mg/dL
Protein, Ur: 36.2 mg/dL
Protein/Creat Ratio: 242 mg/g creat — ABNORMAL HIGH (ref 0–200)

## 2020-06-16 LAB — URINE CULTURE

## 2020-06-19 ENCOUNTER — Ambulatory Visit: Payer: Medicaid Other | Admitting: Dietician

## 2020-06-20 ENCOUNTER — Other Ambulatory Visit: Payer: Self-pay | Admitting: Advanced Practice Midwife

## 2020-06-20 ENCOUNTER — Ambulatory Visit: Payer: Self-pay

## 2020-06-20 ENCOUNTER — Telehealth: Payer: Self-pay | Admitting: Dietician

## 2020-06-20 NOTE — Telephone Encounter (Signed)
Called patient to reschedule her missed appointment from 06/19/20. Left a voicemail message requesting a call back.

## 2020-06-25 ENCOUNTER — Telehealth: Payer: Self-pay | Admitting: Family Medicine

## 2020-06-25 NOTE — Telephone Encounter (Signed)
Pt called saying she feels some minor flu-like symptoms. Would like to know which medications/teas she is allowed to take for treatment.

## 2020-06-25 NOTE — Telephone Encounter (Signed)
Call to client and left message that will most likely need a Covid test and [facility] testing site phone number and address provided in message. Client encouraged to return call and number to call provided. Jossie Ng, RN

## 2020-06-26 ENCOUNTER — Inpatient Hospital Stay
Admission: EM | Admit: 2020-06-26 | Discharge: 2020-06-29 | DRG: 806 | Disposition: A | Discharge status: Home / Self Care | Payer: Medicaid Other | Attending: Advanced Practice Midwife | Admitting: Advanced Practice Midwife

## 2020-06-26 ENCOUNTER — Encounter: Payer: Self-pay | Admitting: Family Medicine

## 2020-06-26 ENCOUNTER — Telehealth: Payer: Self-pay

## 2020-06-26 ENCOUNTER — Encounter: Payer: Self-pay | Admitting: Obstetrics and Gynecology

## 2020-06-26 ENCOUNTER — Inpatient Hospital Stay: Payer: Medicaid Other | Admitting: Anesthesiology

## 2020-06-26 ENCOUNTER — Other Ambulatory Visit: Payer: Self-pay

## 2020-06-26 DIAGNOSIS — O09523 Supervision of elderly multigravida, third trimester: Secondary | ICD-10-CM | POA: Diagnosis not present

## 2020-06-26 DIAGNOSIS — D696 Thrombocytopenia, unspecified: Secondary | ICD-10-CM

## 2020-06-26 DIAGNOSIS — O24420 Gestational diabetes mellitus in childbirth, diet controlled: Secondary | ICD-10-CM | POA: Diagnosis present

## 2020-06-26 DIAGNOSIS — Z3A35 35 weeks gestation of pregnancy: Secondary | ICD-10-CM

## 2020-06-26 DIAGNOSIS — O9912 Other diseases of the blood and blood-forming organs and certain disorders involving the immune mechanism complicating childbirth: Secondary | ICD-10-CM | POA: Diagnosis present

## 2020-06-26 DIAGNOSIS — O1213 Gestational proteinuria, third trimester: Secondary | ICD-10-CM

## 2020-06-26 DIAGNOSIS — O24419 Gestational diabetes mellitus in pregnancy, unspecified control: Secondary | ICD-10-CM

## 2020-06-26 DIAGNOSIS — O42913 Preterm premature rupture of membranes, unspecified as to length of time between rupture and onset of labor, third trimester: Principal | ICD-10-CM | POA: Diagnosis present

## 2020-06-26 DIAGNOSIS — O24429 Gestational diabetes mellitus in childbirth, unspecified control: Secondary | ICD-10-CM | POA: Diagnosis not present

## 2020-06-26 DIAGNOSIS — O99214 Obesity complicating childbirth: Secondary | ICD-10-CM | POA: Diagnosis present

## 2020-06-26 DIAGNOSIS — D6959 Other secondary thrombocytopenia: Secondary | ICD-10-CM | POA: Diagnosis present

## 2020-06-26 DIAGNOSIS — Z87891 Personal history of nicotine dependence: Secondary | ICD-10-CM

## 2020-06-26 DIAGNOSIS — O0993 Supervision of high risk pregnancy, unspecified, third trimester: Secondary | ICD-10-CM | POA: Diagnosis not present

## 2020-06-26 DIAGNOSIS — O42013 Preterm premature rupture of membranes, onset of labor within 24 hours of rupture, third trimester: Secondary | ICD-10-CM | POA: Diagnosis not present

## 2020-06-26 LAB — COMPREHENSIVE METABOLIC PANEL
ALT: 24 U/L (ref 0–44)
AST: 28 U/L (ref 15–41)
Albumin: 3.1 g/dL — ABNORMAL LOW (ref 3.5–5.0)
Alkaline Phosphatase: 125 U/L (ref 38–126)
Anion gap: 10 (ref 5–15)
BUN: 6 mg/dL (ref 6–20)
CO2: 19 mmol/L — ABNORMAL LOW (ref 22–32)
Calcium: 8.8 mg/dL — ABNORMAL LOW (ref 8.9–10.3)
Chloride: 104 mmol/L (ref 98–111)
Creatinine, Ser: 0.44 mg/dL (ref 0.44–1.00)
GFR, Estimated: >60 mL/min (ref >60)
Glucose, Bld: 89 mg/dL (ref 70–99)
Potassium: 3.6 mmol/L (ref 3.5–5.1)
Sodium: 133 mmol/L — ABNORMAL LOW (ref 135–145)
Total Bilirubin: 1.3 mg/dL — ABNORMAL HIGH (ref 0.3–1.2)
Total Protein: 6.9 g/dL (ref 6.5–8.1)

## 2020-06-26 LAB — GROUP B STREP BY PCR: Group B strep by PCR: NEGATIVE

## 2020-06-26 LAB — CBC
HCT: 28.6 % — ABNORMAL LOW (ref 36.0–46.0)
Hemoglobin: 10 g/dL — ABNORMAL LOW (ref 12.0–15.0)
MCH: 31.6 pg (ref 26.0–34.0)
MCHC: 35 g/dL (ref 30.0–36.0)
MCV: 90.5 fL (ref 80.0–100.0)
Platelets: 142 10*3/uL — ABNORMAL LOW (ref 150–400)
RBC: 3.16 MIL/uL — ABNORMAL LOW (ref 3.87–5.11)
RDW: 15.1 % (ref 11.5–15.5)
WBC: 18.3 10*3/uL — ABNORMAL HIGH (ref 4.0–10.5)
nRBC: 0 % (ref 0.0–0.2)

## 2020-06-26 LAB — TYPE AND SCREEN
ABO/RH(D): A POS
Antibody Screen: NEGATIVE

## 2020-06-26 LAB — CHLAMYDIA/NGC RT PCR (ARMC ONLY)
Chlamydia Tr: NOT DETECTED
N gonorrhoeae: NOT DETECTED

## 2020-06-26 LAB — RUPTURE OF MEMBRANE (ROM)PLUS: Rom Plus: POSITIVE

## 2020-06-26 LAB — GLUCOSE, CAPILLARY
Glucose-Capillary: 182 mg/dL — ABNORMAL HIGH (ref 70–99)
Glucose-Capillary: 148 mg/dL — ABNORMAL HIGH (ref 70–99)

## 2020-06-26 MED ORDER — LACTATED RINGERS IV SOLN: INTRAVENOUS | Status: DC

## 2020-06-26 MED ORDER — LIDOCAINE HCL (PF) 1 % IJ SOLN: 30.0000 mL | INTRAMUSCULAR | Status: DC | PRN

## 2020-06-26 MED ORDER — PHENYLEPHRINE 40 MCG/ML (10ML) SYRINGE FOR IV PUSH (FOR BLOOD PRESSURE SUPPORT): 80.0000 ug | PREFILLED_SYRINGE | INTRAVENOUS | Status: DC | PRN

## 2020-06-26 MED ORDER — OXYTOCIN-SODIUM CHLORIDE 30-0.9 UT/500ML-% IV SOLN: 2.5000 [IU]/h | INTRAVENOUS | Status: DC

## 2020-06-26 MED ORDER — FENTANYL 2.5 MCG/ML W/ROPIVACAINE 0.15% IN NS 100 ML EPIDURAL (ARMC)
EPIDURAL | Status: DC | PRN
  Administered 2020-06-26: 12 mL/h via EPIDURAL

## 2020-06-26 MED ORDER — ONDANSETRON HCL 4 MG/2ML IJ SOLN: 4.0000 mg | Freq: Four times a day (QID) | INTRAMUSCULAR | Status: DC | PRN

## 2020-06-26 MED ORDER — LIDOCAINE HCL (PF) 1 % IJ SOLN
INTRAMUSCULAR | Status: DC | PRN
  Administered 2020-06-26: 5 mL via SUBCUTANEOUS

## 2020-06-26 MED ORDER — FENTANYL 2.5 MCG/ML W/ROPIVACAINE 0.15% IN NS 100 ML EPIDURAL (ARMC)
EPIDURAL | Status: AC
  Filled 2020-06-26: qty 100

## 2020-06-26 MED ORDER — OXYTOCIN BOLUS FROM INFUSION
333.0000 mL | Freq: Once | INTRAVENOUS | Status: AC
  Administered 2020-06-27: 333 mL via INTRAVENOUS

## 2020-06-26 MED ORDER — SODIUM CHLORIDE 0.9 % IV SOLN
INTRAVENOUS | Status: DC | PRN
  Administered 2020-06-26 (×2): 5 mL via EPIDURAL

## 2020-06-26 MED ORDER — BUTORPHANOL TARTRATE 1 MG/ML IJ SOLN
1.0000 mg | INTRAMUSCULAR | Status: DC | PRN
Start: 2020-06-26 — End: 2020-06-27

## 2020-06-26 MED ORDER — EPHEDRINE 5 MG/ML INJ: 10.0000 mg | INTRAVENOUS | Status: DC | PRN

## 2020-06-26 MED ORDER — LACTATED RINGERS IV SOLN: 500.0000 mL | INTRAVENOUS | Status: DC | PRN

## 2020-06-26 MED ORDER — BETAMETHASONE SOD PHOS & ACET 6 (3-3) MG/ML IJ SUSP
12.0000 mg | Freq: Once | INTRAMUSCULAR | Status: AC
  Administered 2020-06-26: 12 mg via INTRAMUSCULAR
  Filled 2020-06-26: qty 5

## 2020-06-26 MED ORDER — OXYTOCIN-SODIUM CHLORIDE 30-0.9 UT/500ML-% IV SOLN
1.0000 m[IU]/min | INTRAVENOUS | Status: DC
  Administered 2020-06-26: 2 m[IU]/min via INTRAVENOUS
  Filled 2020-06-26: qty 500

## 2020-06-26 MED ORDER — FENTANYL 2.5 MCG/ML W/ROPIVACAINE 0.15% IN NS 100 ML EPIDURAL (ARMC)
12.0000 mL/h | EPIDURAL | Status: DC
Start: 2020-06-26 — End: 2020-06-27

## 2020-06-26 MED ORDER — ACETAMINOPHEN 325 MG PO TABS
650.0000 mg | ORAL_TABLET | ORAL | Status: DC | PRN
  Administered 2020-06-26: 650 mg via ORAL
  Filled 2020-06-26 (×4): qty 2

## 2020-06-26 MED ORDER — LACTATED RINGERS IV SOLN
500.0000 mL | Freq: Once | INTRAVENOUS | Status: AC
Start: 2020-06-26 — End: 2020-06-26
  Administered 2020-06-26: 500 mL via INTRAVENOUS

## 2020-06-26 MED ORDER — TERBUTALINE SULFATE 1 MG/ML IJ SOLN: 0.2500 mg | Freq: Once | INTRAMUSCULAR | Status: DC | PRN

## 2020-06-26 MED ORDER — LIDOCAINE-EPINEPHRINE (PF) 1.5 %-1:200000 IJ SOLN
INTRAMUSCULAR | Status: DC | PRN
  Administered 2020-06-26: 3 mL via EPIDURAL

## 2020-06-26 MED ORDER — DIPHENHYDRAMINE HCL 50 MG/ML IJ SOLN: 12.5000 mg | INTRAMUSCULAR | Status: DC | PRN

## 2020-06-26 NOTE — Telephone Encounter (Signed)
Left message to call and number to call provided. Jossie Ng, RN

## 2020-06-26 NOTE — Progress Notes (Unsigned)
[facility] Department Maternity Care Conference  Maternity Care Conference Date: 06/26/20  Cynthia Mckay was identified by clinical staff to benefit from an interdisciplinary team approach to help improve pregnancy care.  The [facility] Maternity Care Conference includes the maternity clinic coordinator (RN), medical providers (MD/APP staff), Care Management -OBCM and Healthy Beginnings, Centering Pregnancy coordinator, Infant Mortality reduction Dietitian.  Nursing staff are also encouraged to participate. The group meets monthly to discuss patient care and coordinate services.   The patient's care care at the agency was reviewed in EMR and high risk factors evaluated in an interdisciplinary approach.    Value added interventions discussed at this care conference today were:   06/20/20 Meeting  Diabetes is controlled and needs return visits regularly.

## 2020-06-26 NOTE — Telephone Encounter (Signed)
Received phone call from patient stating she has been having back pain since last night, is feeling a lot of pressure "like the baby is pushing" and having clear to pinkish vaginal discharge. States "I can't hold my pee either. I have four pads on and I'm still leaking through them." Patient states baby is moving well, denies fever as well as current burning and stinging with urination. States "I was burning last night but not today." RN consulted with E. Sciora, CNM which recommended that patient go to ED now for evaluation of above symptoms. Patient agreeable. Tawny Hopping, RN

## 2020-06-26 NOTE — Progress Notes (Signed)
  Labor Progress Note   40 y.o. [id number] @ [redacted]w[redacted]d , admitted for  Pregnancy, Labor Management.   Subjective:  Comfortable with epidural  Objective:  BP 116/64   Pulse 99   Temp 98.9 F (37.2 C) (Axillary)   Resp 18   Ht 5\' 7"  (1.702 m)   Wt 119.7 kg   LMP 11/08/2019 (Approximate)   SpO2 98%   BMI 41.35 kg/m  Abd: gravid, ND, FHT present, mild tenderness on exam Extr: trace to 1+ bilateral pedal edema SVE: CERVIX: 4 cm dilated, 80 effaced, -2 station  EFM: FHR: 145 bpm, variability: moderate,  accelerations:  Present,  decelerations:  Present variable decelerations Toco: Frequency: Every 1.5-3.5 minutes Labs: I have reviewed the patient's lab results.   Assessment & Plan:  01/08/2020 @ [redacted]w[redacted]d, admitted for  Pregnancy and Labor/Delivery Management  1. Pain management: epidural. 2. FWB: FHT category II and overall reassuring.  3. ID: GBS negative 4. Labor management: continue pitocin titration  All discussed with patient, see orders   [redacted]w[redacted]d, CNM [facility] 06/26/2020  11:25 PM

## 2020-06-26 NOTE — Anesthesia Procedure Notes (Signed)
Epidural Patient location during procedure: OB Start time: 06/26/2020 10:11 PM End time: 06/26/2020 10:26 PM  Staffing Anesthesiologist: Lenard Simmer, MD Performed: anesthesiologist   Preanesthetic Checklist Completed: patient identified, IV checked, site marked, risks and benefits discussed, surgical consent, monitors and equipment checked, pre-op evaluation and timeout performed  Epidural Patient position: sitting Prep: ChloraPrep Patient monitoring: heart rate, continuous pulse ox and blood pressure Approach: midline Location: L3-L4 Injection technique: LOR saline  Needle:  Needle type: Tuohy  Needle gauge: 17 G Needle length: 9 cm and 9 Needle insertion depth: 7 cm Catheter type: closed end flexible Catheter size: 19 Gauge Catheter at skin depth: 12 cm Test dose: negative and 1.5% lidocaine with Epi 1:200 K  Assessment Sensory level: T10 Events: blood not aspirated, injection not painful, no injection resistance, no paresthesia and negative IV test  Additional Notes 1st attempt Pt. Evaluated and documentation done after procedure finished. Patient identified. Risks/Benefits/Options discussed with patient including but not limited to bleeding, infection, nerve damage, paralysis, failed block, incomplete pain control, headache, blood pressure changes, nausea, vomiting, reactions to medication both or allergic, itching and postpartum back pain. Confirmed with bedside nurse the patient's most recent platelet count. Confirmed with patient that they are not currently taking any anticoagulation, have any bleeding history or any family history of bleeding disorders. Patient expressed understanding and wished to proceed. All questions were answered. Sterile technique was used throughout the entire procedure. Please see nursing notes for vital signs. Test dose was given through epidural catheter and negative prior to continuing to dose epidural or start infusion. Warning signs of high  block given to the patient including shortness of breath, tingling/numbness in hands, complete motor block, or any concerning symptoms with instructions to call for help. Patient was given instructions on fall risk and not to get out of bed. All questions and concerns addressed with instructions to call with any issues or inadequate analgesia.   Patient tolerated the insertion well without immediate complications.Reason for block:procedure for pain

## 2020-06-26 NOTE — Progress Notes (Signed)
Spoke with Cherrie from infection prevention regarding covid testing for patient. Patient with previous positive covid test on 04/23/20, still within 90 day window. Patient does present with congestion and low grade temperature (99.6); however, does not meet criteria for retesting per infection prevention. Concern regarding gestational age of infant and possible need for admission to SCN.  Infection prevention recommended discussing with OB provider to confirm whether or not to re-test patient.
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
## 2020-06-26 NOTE — Anesthesia Preprocedure Evaluation (Signed)
Anesthesia Evaluation  Patient identified by MRN, date of birth, ID band Patient awake    Reviewed: Allergy & Precautions, H&P , NPO status , Patient's Chart, lab work & pertinent test results, reviewed documented beta blocker date and time   History of Anesthesia Complications Negative for: history of anesthetic complications  Airway Mallampati: II  TM Distance: >3 FB Neck ROM: full    Dental  (+) Dental Advidsory Given, Teeth Intact   Pulmonary neg pulmonary ROS, former smoker,    Pulmonary exam normal breath sounds clear to auscultation       Cardiovascular Exercise Tolerance: Good negative cardio ROS Normal cardiovascular exam Rhythm:regular Rate:Normal     Neuro/Psych negative neurological ROS  negative psych ROS   GI/Hepatic Neg liver ROS, GERD  ,  Endo/Other  diabetes, GestationalMorbid obesity  Renal/GU negative Renal ROS  negative genitourinary   Musculoskeletal   Abdominal   Peds  Hematology  (+) Blood dyscrasia, anemia ,   Anesthesia Other Findings Past Medical History: No date: Anemia No date: Gestational diabetes   Reproductive/Obstetrics (+) Pregnancy                             Anesthesia Physical Anesthesia Plan  ASA: III  Anesthesia Plan: Epidural   Post-op Pain Management:    Induction:   PONV Risk Score and Plan:   Airway Management Planned:   Additional Equipment:   Intra-op Plan:   Post-operative Plan:   Informed Consent: I have reviewed the patients History and Physical, chart, labs and discussed the procedure including the risks, benefits and alternatives for the proposed anesthesia with the patient or authorized representative who has indicated his/her understanding and acceptance.     Dental Advisory Given  Plan Discussed with: Anesthesiologist, CRNA and Surgeon  Anesthesia Plan Comments:         Anesthesia Quick Evaluation

## 2020-06-26 NOTE — Telephone Encounter (Signed)
Tawny Hopping RN spoke with client earlier today and new telephone encounter opened. Please refer to encounter. Jossie Ng, RN

## 2020-06-26 NOTE — OB Triage Note (Signed)
Pt was brought into the emergency department via EMS with chief complaint of ctx, and leaking fluid.

## 2020-06-27 ENCOUNTER — Encounter: Payer: Self-pay | Admitting: Dietician

## 2020-06-27 ENCOUNTER — Encounter: Payer: Self-pay | Admitting: Obstetrics and Gynecology

## 2020-06-27 DIAGNOSIS — O0993 Supervision of high risk pregnancy, unspecified, third trimester: Secondary | ICD-10-CM

## 2020-06-27 DIAGNOSIS — O09523 Supervision of elderly multigravida, third trimester: Secondary | ICD-10-CM

## 2020-06-27 DIAGNOSIS — O24429 Gestational diabetes mellitus in childbirth, unspecified control: Secondary | ICD-10-CM

## 2020-06-27 DIAGNOSIS — Z3A35 35 weeks gestation of pregnancy: Secondary | ICD-10-CM

## 2020-06-27 DIAGNOSIS — O42013 Preterm premature rupture of membranes, onset of labor within 24 hours of rupture, third trimester: Secondary | ICD-10-CM

## 2020-06-27 LAB — GLUCOSE, CAPILLARY
Glucose-Capillary: 134 mg/dL — ABNORMAL HIGH (ref 70–99)
Glucose-Capillary: 137 mg/dL — ABNORMAL HIGH (ref 70–99)
Glucose-Capillary: 157 mg/dL — ABNORMAL HIGH (ref 70–99)
Glucose-Capillary: 122 mg/dL — ABNORMAL HIGH (ref 70–99)
Glucose-Capillary: 146 mg/dL — ABNORMAL HIGH (ref 70–99)

## 2020-06-27 LAB — RPR: RPR Ser Ql: NONREACTIVE

## 2020-06-27 LAB — HEMOGLOBIN A1C
Hgb A1c MFr Bld: 4.4 % — ABNORMAL LOW (ref 4.8–5.6)
Mean Plasma Glucose: 79.58 mg/dL

## 2020-06-27 MED ORDER — DIBUCAINE (PERIANAL) 1 % EX OINT: 1.0000 "application " | TOPICAL_OINTMENT | CUTANEOUS | Status: DC | PRN

## 2020-06-27 MED ORDER — INSULIN REGULAR(HUMAN) IN NACL 100-0.9 UT/100ML-% IV SOLN
INTRAVENOUS | Status: DC
  Administered 2020-06-27: 2.2 [IU]/h via INTRAVENOUS
  Filled 2020-06-27 (×2): qty 100

## 2020-06-27 MED ORDER — LACTATED RINGERS IV SOLN: INTRAVENOUS | Status: DC

## 2020-06-27 MED ORDER — BENZOCAINE-MENTHOL 20-0.5 % EX AERO
1.0000 "application " | INHALATION_SPRAY | CUTANEOUS | Status: DC | PRN
  Administered 2020-06-27: 1 via TOPICAL
  Filled 2020-06-27: qty 56

## 2020-06-27 MED ORDER — IBUPROFEN 600 MG PO TABS
600.0000 mg | ORAL_TABLET | Freq: Four times a day (QID) | ORAL | Status: DC
  Administered 2020-06-27 – 2020-06-28 (×4): 600 mg via ORAL
  Filled 2020-06-27 (×4): qty 1

## 2020-06-27 MED ORDER — DEXTROSE IN LACTATED RINGERS 5 % IV SOLN: INTRAVENOUS | Status: DC

## 2020-06-27 MED ORDER — SENNOSIDES-DOCUSATE SODIUM 8.6-50 MG PO TABS
2.0000 | ORAL_TABLET | Freq: Every day | ORAL | Status: DC
  Administered 2020-06-28 – 2020-06-29 (×2): 2 via ORAL
  Filled 2020-06-27 (×2): qty 2

## 2020-06-27 MED ORDER — DEXTROSE 50 % IV SOLN: 0.0000 mL | INTRAVENOUS | Status: DC | PRN

## 2020-06-27 MED ORDER — PRENATAL MULTIVITAMIN CH
1.0000 | ORAL_TABLET | Freq: Every day | ORAL | Status: DC
  Administered 2020-06-27 – 2020-06-29 (×3): 1 via ORAL
  Filled 2020-06-27 (×3): qty 1

## 2020-06-27 MED ORDER — WITCH HAZEL-GLYCERIN EX PADS
1.0000 "application " | MEDICATED_PAD | CUTANEOUS | Status: DC | PRN
  Administered 2020-06-29: 1 via TOPICAL
  Filled 2020-06-27: qty 100

## 2020-06-27 MED ORDER — TETANUS-DIPHTH-ACELL PERTUSSIS 5-2.5-18.5 LF-MCG/0.5 IM SUSY
0.5000 mL | PREFILLED_SYRINGE | Freq: Once | INTRAMUSCULAR | Status: DC
  Filled 2020-06-27: qty 0.5

## 2020-06-27 MED ORDER — SIMETHICONE 80 MG PO CHEW: 80.0000 mg | CHEWABLE_TABLET | ORAL | Status: DC | PRN

## 2020-06-27 MED ORDER — ONDANSETRON HCL 4 MG/2ML IJ SOLN: 4.0000 mg | INTRAMUSCULAR | Status: DC | PRN

## 2020-06-27 MED ORDER — DIPHENHYDRAMINE HCL 25 MG PO CAPS: 25.0000 mg | ORAL_CAPSULE | Freq: Four times a day (QID) | ORAL | Status: DC | PRN

## 2020-06-27 MED ORDER — ACETAMINOPHEN 325 MG PO TABS
650.0000 mg | ORAL_TABLET | ORAL | Status: DC | PRN
  Administered 2020-06-27 (×3): 650 mg via ORAL

## 2020-06-27 MED ORDER — COCONUT OIL OIL
1.0000 "application " | TOPICAL_OIL | Status: DC | PRN
  Administered 2020-06-29: 1 via TOPICAL
  Filled 2020-06-27: qty 120

## 2020-06-27 MED ORDER — ONDANSETRON HCL 4 MG PO TABS
4.0000 mg | ORAL_TABLET | ORAL | Status: DC | PRN
  Filled 2020-06-27: qty 1

## 2020-06-27 NOTE — Lactation Note (Signed)
This note was copied from a baby's chart. Lactation Consultation Note  Patient Name: Cynthia Mckay UUVOZ'D Date: 06/27/2020 Reason for consult: Initial assessment;Late-preterm 34-36.6wks;NICU baby;Infant < 6lbs Age:38 hours  Initial lactation visit. Mom is G8P6, SVD to 35wk infant who is in SCN d/t respiratory distress and hypoglycemia. Mom is moved to [facility] and desires to breast and bottle feed. Mom has 5 other children ranging in age from 65yrs to 67yrs, and a BF history of 7 months, with formula supplement.  Baby brought from [facility] into room. BS, O2 stable, etc. Feeding plan is to put baby to breast and supplement with any volume of 22cal [place] to help maintain blood sugar levels.  Blood sugar prior to feed was 56, parents dressed baby and mom brought baby in, in cradle hold for first feeding at the breast. Baby opened wide immediately and began sucking. Baby appeared to have shallow latch and some sound of clicking, once adjusted baby was deeper on the breast and clicking went away.  Parents encouraged to to feed baby at breast for 15-20 minutes and then provide supplement- unspecified volume needed but encouraged post breastfeeding. LC explained bottle prep and paced-bottle feeding. Parents verbalize understanding.  LC at bedside for education on need for pump for stimulation, and hand expression for additional colostrum removal. Mom declined pumping at this time stating she felt that would be too overwhelming.  Maternal Data Has patient been taught Hand Expression?: Yes  Feeding Mother's Current Feeding Choice: Breast Milk and Formula  LATCH Score                    Lactation Tools Discussed/Used Tools: Pump Breast pump type: Double-Electric Breast Pump Pump Education: Setup, frequency, and cleaning;Milk Storage Reason for Pumping: baby in SCN; preterm protocol Pumping frequency: q 3hrs  Interventions Interventions: Breast feeding basics reviewed;Hand  express;Education;DEBP  Discharge    Consult Status Consult Status: Follow-up Date: 06/27/20 Follow-up type: In-patient    Danford Bad 06/27/2020, 9:10 AM

## 2020-06-27 NOTE — Progress Notes (Signed)
Endo tool ordered for 2 consecutive blood sugars above 120.

## 2020-06-27 NOTE — Progress Notes (Signed)
Patient did not complete gestational diabetes education as scheduled on 06/19/20; no need to reschedule as she has delivered her baby on 06/26/20.

## 2020-06-27 NOTE — Progress Notes (Signed)
Pt seen and discussed postpartum tubal options Pros and cons, permanency, and failure rates Plan interval laparoscopic procedure based on BMI as safer alternative.    Also as infant is premature and still adjusting to newborn life    Pt also has had gestation diabetes; cont to monitor BS for now Counseled on contraception until that time (will consider PP POP or Depo)  Annamarie Major, MD, [facility], [facility] 06/27/2020  7:15 AM

## 2020-06-27 NOTE — Discharge Summary (Addendum)
OB Discharge Summary     Patient Name: Cynthia Mckay DOB: 11-21-1980 MRN: [id number]  Date of admission: 06/26/2020 Delivering provider: Rod Can, CNM  Date of Delivery: 06/27/2020  Date of discharge: 06/29/2020  Admitting diagnosis: Labor and delivery, indication for care [O75.9] Preterm premature rupture of membranes in third trimester [O42.913] Intrauterine pregnancy: [redacted]w[redacted]d    Secondary diagnosis: Gestational Diabetes diet controlled (A1)     Discharge diagnosis: Preterm Pregnancy Delivered and GDM A1                                                                                                Post partum procedures:none  Augmentation: Pitocin  Complications: None  Hospital course:  Induction of Labor With Vaginal Delivery   40y.o. yo [id number] admitted to the hospital 06/26/2020 for induction of labor.  Indication for induction: PROM.  Patient had an uncomplicated labor course as follows: Membrane Rupture Time/Date:  ,06/25/2020   Delivery Method:Vaginal, Spontaneous  Episiotomy: None  Lacerations:  None  Details of delivery can be found in separate delivery note.    Patient had a routine postpartum course.   Patient is discharged home 06/29/20.  Newborn Data: Birth date:06/27/2020  Birth time:5:48 AM  Gender:Female JoMartiniqueosiah Living status:Living  Apgars:8 ,9  Weight: 5 pounds 7 ounces  Physical exam  Vitals:   06/28/20 0845 06/28/20 1642 06/28/20 2240 06/29/20 0805  BP: 111/65 122/62 129/77 121/66  Pulse: 73 77 85 78  Resp: _0 Temp: 97.6 F (36.4 C) 98 F (36.7 C) 98.4 F (36.9 C) 98.4 F (36.9 C)  TempSrc: Oral Oral Oral Oral  SpO2: 100% 100% 100% 100%  Weight:      Height:       General: alert, cooperative and no distress Lochia: appropriate Uterine Fundus: firm Incision: N/A DVT Evaluation: No evidence of DVT seen on physical exam. Negative Homan's sign.  Labs: Lab Results  Component Value Date   WBC  18.0 (H) 06/28/2020   HGB 9.2 (L) 06/28/2020   HCT 26.0 (L) 06/28/2020   MCV 91.9 06/28/2020   PLT 156 06/28/2020    Discharge instruction: per After Visit Summary.  Medications:  Allergies as of 06/29/2020      Reactions   Tramadol Palpitations      Medication List    STOP taking these medications   blood glucose meter kit and supplies Kit   BLOOD GLUCOSE TEST STRIPS Strp   Ez Smart Blood Glucose Lancets Misc     TAKE these medications   acetaminophen-codeine 300-30 MG tablet Commonly known as: TYLENOL #3 Take 1 tablet by mouth every 8 (eight) hours as needed for moderate pain.   ferrous fumarate 325 (106 Fe) MG Tabs tablet Commonly known as: HEMOCYTE - 106 mg FE Take 1 tablet by mouth.   oxyCODONE-acetaminophen 5-325 MG tablet Commonly known as: PERCOCET/ROXICET Take 1 tablet by mouth every 4 (four) hours as needed for severe pain.   PrePLUS 27-1 MG Tabs Take 1 tablet by mouth daily.   vitamin C 1000 MG tablet Take 1,000 mg  by mouth daily.       Diet: carb modified diet  Activity: Advance as tolerated. Pelvic rest for 6 weeks.   Outpatient follow up:  Follow-up Information    Department, [facility]. Schedule an appointment as soon as possible for a visit in 6 week(s).   Why: postpartum visit Contact information: [place] [address] [phone]        Gae Dry, MD. Schedule an appointment as soon as possible for a visit in 4 week(s).   Specialty: Obstetrics and Gynecology Why: For pst partum as well as pre-op for tubal Contact information: [address] [place] [address] [phone]                 Postpartum contraception: plans a tubal ligation  Rhogam Given postpartum: NA Rubella vaccine given postpartum: no Varicella vaccine given postpartum: no TDaP given antepartum or postpartum: yes   Newborn Delivery   Birth date/time: 06/27/2020 05:48:00 Delivery type:  Vaginal, Spontaneous       Baby Feeding: Breast and Formula  Disposition:home with mother  A total of 30 minutes were spent face-to-face with the patient as well as preparation, review, communication, and documentation during this encounter.   SIGNED:  Hoyt Koch, MD 06/29/2020 8:42 AM

## 2020-06-27 NOTE — Telephone Encounter (Signed)
Consulted on the plan of care for this client.  I agree with the documented note and actions taken to provide care for this client.  E. Mack Thurmon, CNM  

## 2020-06-28 ENCOUNTER — Ambulatory Visit: Payer: Self-pay

## 2020-06-28 LAB — MEASLES/MUMPS/RUBELLA IMMUNITY
Mumps IgG: 19.1 AU/mL (ref >10.9)
Rubella: 1.6 index (ref >0.99)
Rubeola IgG: 178 AU/mL (ref >16.4)

## 2020-06-28 LAB — CBC
HCT: 26 % — ABNORMAL LOW (ref 36.0–46.0)
Hemoglobin: 9.2 g/dL — ABNORMAL LOW (ref 12.0–15.0)
MCH: 32.5 pg (ref 26.0–34.0)
MCHC: 35.4 g/dL (ref 30.0–36.0)
MCV: 91.9 fL (ref 80.0–100.0)
Platelets: 156 10*3/uL (ref 150–400)
RBC: 2.83 MIL/uL — ABNORMAL LOW (ref 3.87–5.11)
RDW: 15.8 % — ABNORMAL HIGH (ref 11.5–15.5)
WBC: 18 10*3/uL — ABNORMAL HIGH (ref 4.0–10.5)
nRBC: 0 % (ref 0.0–0.2)

## 2020-06-28 LAB — VARICELLA ZOSTER ANTIBODY, IGG: Varicella IgG: 519 index (ref >165)

## 2020-06-28 MED ORDER — OXYCODONE-ACETAMINOPHEN 5-325 MG PO TABS
1.0000 | ORAL_TABLET | ORAL | Status: DC | PRN
  Administered 2020-06-28 – 2020-06-29 (×5): 1 via ORAL
  Filled 2020-06-28 (×5): qty 1

## 2020-06-28 MED ORDER — IBUPROFEN 600 MG PO TABS
600.0000 mg | ORAL_TABLET | Freq: Four times a day (QID) | ORAL | Status: DC
  Administered 2020-06-28 – 2020-06-29 (×5): 600 mg via ORAL
  Filled 2020-06-28 (×5): qty 1

## 2020-06-28 NOTE — Anesthesia Postprocedure Evaluation (Signed)
Anesthesia Post Note  Patient: Cynthia Mckay  Procedure(s) Performed: AN AD HOC LABOR EPIDURAL  Patient location during evaluation: Mother Baby Anesthesia Type: Epidural Level of consciousness: awake Pain management: pain level controlled Respiratory status: spontaneous breathing Postop Assessment: no headache Anesthetic complications: no   No complications documented.   Last Vitals:  Vitals:   06/27/20 1916 06/27/20 2314  BP: 116/70 121/81  Pulse: 91 85  Resp: 20 20  Temp: 36.6 C 36.4 C  SpO2: 98% 99%    Last Pain:  Vitals:   06/28/20 0334  TempSrc:   PainSc: 5                  Jaye Beagle

## 2020-06-28 NOTE — Lactation Note (Addendum)
This note was copied from a baby's chart. Lactation Consultation Note  Patient Name: Cynthia Mckay Date: 06/28/2020   Age:38 hours  Maternal Data  Mom has been formula feeding today, baby is under double bili-lights, mom states breastfeeding makes her cramp a lot, voiced understanding but recommended her to offer breast as much as possible and supplement after to assist baby with learning to breastfeed and help her make milk, she states the baby latches well to the breast.  She stated she will attempt breastfeeding later at next feeding and will call LC if she needs anything.   Feeding Nipple Type: Slow - flow  LATCH Score                    Lactation Tools Discussed/Used    Interventions  LC name and no written on white board for mom to call for questions, concerns or for assistance   Discharge    Consult Status      Dyann Kief 06/28/2020, 2:03 PM

## 2020-06-28 NOTE — Progress Notes (Signed)
Post Partum Day 1 Subjective: no complaints, up ad lib, voiding, tolerating PO and and she is concerned about her baby being under the billi lights  Objective: Blood pressure 121/81, pulse 85, temperature 97.6 F (36.4 C), temperature source Oral, resp. rate 20, height 5\' 7"  (1.702 m), weight 119.7 kg, last menstrual period 11/08/2019, SpO2 99 %, unknown if currently breastfeeding.  Physical Exam:  General: alert, fatigued, mild distress and morbidly obese Lochia: appropriate Uterine Fundus: firm Incision: healing well, intact perineum DVT Evaluation: No evidence of DVT seen on physical exam. Negative Homan's sign. Multiple spider veins and varicosities  Recent Labs    06/26/20 1743 06/28/20 0507  HGB 10.0* 9.2*  HCT 28.6* 26.0*    Assessment/Plan: Plan for discharge tomorrow and Contraception plans a serial BTL  Careful explanation regarding her baby's need for bili ights.   LOS: 2 days   06/30/20 06/28/2020, 9:47 AM

## 2020-06-29 MED ORDER — OXYCODONE-ACETAMINOPHEN 5-325 MG PO TABS: 1.0000 | ORAL_TABLET | ORAL | 0 refills | Status: DC | PRN

## 2020-06-29 NOTE — Lactation Note (Signed)
This note was copied from a baby's chart. Lactation Consultation Note  Patient Name: Cynthia Mckay [id number] Date: 06/29/2020 Reason for consult: Follow-up assessment;Late-preterm 34-36.6wks;Infant < 6lbs;Hyperbilirubinemia;Other (Comment) (Mom giving bottles of 22 calorie formula as ordered by Dr. Dierdre Highman) Age:38 hours Cynthia Mckay breast fed well with strong rhythmic sucking and occasional audible swallows heard.  Mom pumped using Symphony pump after breast feeding.  She did not express any milk from right breast that she had just breast fed.  She pumped 5 ml colostrum from the left breast which was given to Cynthia via bottle with slow flow nipple.  Pediatrician wants Cynthia Mckay to get 22 calorie Similac Neosure d/t to hyperbilirubinemia with him just coming off double phototherapy and going to be discharged home today.  He was also born at 35.[redacted] weeks gestation.  Mom had not been breast feeding consistently with mom's reason being it was causing some severe cramping.  The cramping has decreased.  Her plan is to breast feed first and then to follow with 22 cal formula.  Mom voices only slight tenderness after breast feeding and pumping and no cramping.  Coconut oil given with instructions in use.  Hand outs given on what to expect with breast feeding the first 4 days of life.  Discussed supply and demand and engorgement and the need to drain breast frequently either by breast feeding or pumping.  Hand out given on follow up support groups and community resources and reviewed.  Encouraged mom to call with any questions, concerns or assistance. Maternal Data Has patient been taught Hand Expression?: Yes Does the patient have breastfeeding experience prior to this delivery?: Yes How long did the patient breastfeed?: 7 months  Feeding Mother's Current Feeding Choice: Breast Milk and Formula  LATCH Score Latch: Grasps breast easily, tongue down, lips flanged, rhythmical sucking.  Audible  Swallowing: A few with stimulation  Type of Nipple: Everted at rest and after stimulation  Comfort (Breast/Nipple): Soft / non-tender  Hold (Positioning): No assistance needed to correctly position infant at breast.  LATCH Score: 9   Lactation Tools Discussed/Used Tools: Pump;Coconut oil;Other (comment);Bottle Breast pump type: Double-Electric Breast Pump (Mom only wanted to pump one breast at a time beginning with right breast that she had just fed on) Pump Education: Setup, frequency, and cleaning;Milk Storage;Other (comment) Reason for Pumping: Supplementing with 22 calorie Similiac Neosure Pumping frequency: This was mom's first time to pump Pumped volume: 5 mL (Did not express any from right breast that she had just nursed from, but expressed 10 ml from left breast which was given to Cynthia Mckay)  Interventions Interventions: Breast feeding basics reviewed;Breast massage;Hand express;Breast compression;Support pillows;Coconut oil;Expressed milk;DEBP;Education  Discharge Discharge Education: Engorgement and breast care;Warning signs for feeding baby;Outpatient recommendation Pump: DEBP;Manual (Used Symphony while here and demonstrated manual for home use) WIC Program: Yes  Consult Status Consult Status: PRN Date: 06/29/20 Follow-up type: Call as needed    Louis Meckel 06/29/2020, 1:39 PM

## 2020-06-29 NOTE — Discharge Instructions (Signed)
Discharge Instructions:   Follow-up Appointment: Call ASAP and schedule a follow-up appointment for a visit in 4 weeks with Dr. Tiburcio Pea and for tubal pre-op!   If there are any new medications, they have been ordered and will be available for pickup at the listed pharmacy on your way home from the hospital.   Call office if you have any of the following: headache, visual changes, fever >101.0 F, chills, shortness of breath, breast concerns, excessive vaginal bleeding, incision drainage or problems, leg pain or redness, depression or any other concerns. If you have vaginal discharge with an odor, let your doctor know.   It is normal to bleed for up to 6 weeks. You should not soak through more than 1 pad in 1 hour. If you have a blood clot larger than your fist with continued bleeding, call your doctor.   Activity: Do not lift > 10 lbs for 6 weeks (do not lift anything heavier than your baby). No intercourse, tampons, swimming pools, hot tubs, baths (only showers) for 6 weeks.  No driving for 1-2 weeks. Continue prenatal vitamin, especially if breastfeeding. Increase calories and fluids (water) while breastfeeding.   Your milk will come in, in the next couple of days (right now it is colostrum). You may have a slight fever when your milk comes in, but it should go away on its own.  If it does not, and rises above 101 F please call the doctor. You will also feel achy and your breasts will be firm. They will also start to leak. If you are breastfeeding, continue as you have been and you can pump/express milk for comfort.   If you have too much milk, your breasts can become engorged, which could lead to mastitis. This is an infection of the milk ducts. It can be very painful and you will need to notify your doctor to obtain a prescription for antibiotics. You can also treat it with a shower or hot/cold compress.   For concerns about your baby, please call your pediatrician.  For breastfeeding  concerns, the lactation consultant can be reached at [phone].   Postpartum blues (feelings of happy one minute and sad another minute) are normal for the first few weeks but if it gets worse let your doctor know.   Congratulations! We enjoyed caring for you and your new bundle of joy!

## 2020-06-29 NOTE — Progress Notes (Signed)
Patient discharged home with infant. Discharge instructions and prescriptions given and reviewed with patient. Patient verbalized understanding.   Explained importance of calling and scheduling follow-up appointment with Dr. Tiburcio Pea in 4 weeks for a postpartum visit and pre-op for tubal.   Escorted out by volunteers.

## 2020-06-29 NOTE — Progress Notes (Signed)
Admit Date: 06/26/2020 Today's Date: 06/29/2020  Post Partum Day 2  Subjective:  no complaints  Objective: Temp:  [97.6 F (36.4 C)-98.4 F (36.9 C)] 98.4 F (36.9 C) (04/23 0805) Pulse Rate:  [73-85] 78 (04/23 0805) Resp:  [16-18] 18 (04/23 0805) BP: (111-129)/(62-77) 121/66 (04/23 0805) SpO2:  [100 %] 100 % (04/23 0805)  Physical Exam:  General: alert, cooperative and no distress Lochia: appropriate Uterine Fundus: firm Incision: none DVT Evaluation: No evidence of DVT seen on physical exam.  Recent Labs    06/26/20 1743 06/28/20 0507  HGB 10.0* 9.2*  HCT 28.6* 26.0*    Assessment/Plan: Discharge home, Breastfeeding, Contraception (plans BTL, to schedule in 4-6 weeks) and Infant doing well   LOS: 3 days   Letitia Libra [facility] 06/29/2020, 8:43 AM

## 2020-07-01 ENCOUNTER — Telehealth: Payer: Self-pay

## 2020-07-01 NOTE — Telephone Encounter (Signed)
-----   Message from Nadara Mustard, MD sent at 06/27/2020  7:12 AM EDT ----- Regarding: Sch Tubal as Laparoscopy in 5-6 weeks Surgery Booking Request Patient Full Name:  Cynthia Mckay  MRN: [id number]  DOB: Aug 19, 1980  Surgeon: Letitia Libra, MD  Requested Surgery Date and Time: in 5-6 weeks; sch postpartum /preop in 4 weeks if not already done Primary Diagnosis AND Code: Sterilization admission Secondary Diagnosis and Code:  Surgical Procedure: Laparoscopy with Tubal Partial Salingectomy RNFA Requested?: No L&D Notification: No Admission Status: same day surgery Length of Surgery: 25 min Special Case Needs: No H&P: Yes Phone Interview???:  Yes Interpreter: No Medical Clearance:  No Special Scheduling Instructions: No Any known health/anesthesia issues, diabetes, sleep apnea, latex allergy, defibrillator/pacemaker?: Yes (DIABETES) Acuity: P3   (P1 highest, P2 delay may cause harm, P3 low, elective gyn, P4 lowest)

## 2020-07-01 NOTE — Telephone Encounter (Signed)
Left a message for the patient to return the call.  

## 2020-07-02 ENCOUNTER — Ambulatory Visit: Payer: Medicaid Other

## 2020-07-07 ENCOUNTER — Other Ambulatory Visit: Payer: Self-pay | Admitting: Obstetrics & Gynecology

## 2020-07-24 ENCOUNTER — Telehealth: Payer: Self-pay

## 2020-07-24 NOTE — Telephone Encounter (Signed)
-----   Message from Nadara Mustard, MD sent at 06/29/2020  8:41 AM EDT ----- Regarding: Sch Lap Tubal in 4-6 weeks, and appt to see her prior Surgery Booking Request Patient Full Name:  Cynthia Mckay  MRN: [id number]  DOB: 05-05-1980  Surgeon: Letitia Libra, MD  Requested Surgery Date and Time: 4-6 weeks Primary Diagnosis AND Code: Sterilization Secondary Diagnosis and Code:  Surgical Procedure: Laparoscopy with Tubal Partial Salingectomy RNFA Requested?: No L&D Notification: No Admission Status: same day surgery Length of Surgery: 50 min Special Case Needs: No H&P: Yes Phone Interview???:  Yes Interpreter: No Medical Clearance:  No Special Scheduling Instructions: No Any known health/anesthesia issues, diabetes, sleep apnea, latex allergy, defibrillator/pacemaker?: No Acuity: P3   (P1 highest, P2 delay may cause harm, P3 low, elective gyn, P4 lowest)

## 2020-07-24 NOTE — Telephone Encounter (Signed)
Patient's case worker contacted me to schedule patient for BTL w [facility]. I advised that I had called the patient and was waiting to hear back from her.  DOS 5/24  H&P 5/19 @ 2:30   Pre-admit phone call appointment to be requested - date and time will be included on H&P paper work. Will explain that this appointment has a call window. Based on the time scheduled will indicate if the call will be received within a 4 hour window before 1:00 or after.  Advised that pt may also receive calls from the hospital pharmacy and pre-service center.  Confirmed pt has [facility] Medicaid as primary insurance. No secondary insurance.

## 2020-07-25 ENCOUNTER — Other Ambulatory Visit: Payer: Self-pay | Admitting: Obstetrics & Gynecology

## 2020-07-25 ENCOUNTER — Other Ambulatory Visit: Payer: Self-pay

## 2020-07-25 ENCOUNTER — Ambulatory Visit (INDEPENDENT_AMBULATORY_CARE_PROVIDER_SITE_OTHER): Payer: Medicaid Other | Admitting: Obstetrics & Gynecology

## 2020-07-25 ENCOUNTER — Encounter: Payer: Self-pay | Admitting: Obstetrics & Gynecology

## 2020-07-25 VITALS — BP 120/80 | Ht 66.0 in | Wt 248.0 lb

## 2020-07-25 DIAGNOSIS — Z302 Encounter for sterilization: Secondary | ICD-10-CM

## 2020-07-25 NOTE — Progress Notes (Signed)
PRE-OPERATIVE HISTORY AND PHYSICAL EXAM  HPI:  Cynthia Mckay is a 40 y.o. [id number] No LMP recorded.; she is being admitted for surgery related to requested sterilization.  PMHx: Past Medical History:  Diagnosis Date  . Anemia   . Gestational diabetes    Past Surgical History:  Procedure Laterality Date  . GALLBLADDER SURGERY     Family History  Problem Relation Age of Onset  . Hypertension Mother   . Hypertension Father   . Depression Brother   . Renal Disease Paternal Grandmother   . Breast cancer Maternal Aunt    Social History   Tobacco Use  . Smoking status: Former Smoker    Packs/day: 0.50    Years: 15.00    Pack years: 7.50    Types: Cigarettes    Quit date: 03/09/2020    Years since quitting: 0.3  . Smokeless tobacco: Never Used  . Tobacco comment: No secondhand smoke exposure  Vaping Use  . Vaping Use: Never used  Substance Use Topics  . Alcohol use: Not Currently    Alcohol/week: 1.0 standard drink    Types: 1 Glasses of wine per week    Comment: last use- 2 mo ago  . Drug use: No    Current Outpatient Medications:  .  ferrous fumarate (HEMOCYTE - 106 MG FE) 325 (106 FE) MG TABS tablet, Take 1 tablet by mouth 2 (two) times daily., Disp: , Rfl:  .  Prenatal Vit-Fe Fumarate-FA (PREPLUS) 27-1 MG TABS, Take 1 tablet by mouth daily. (Patient not taking: No sig reported), Disp: 100 tablet, Rfl: 3 Allergies: Tramadol  Review of Systems  Constitutional: Negative for chills, fever and malaise/fatigue.  HENT: Negative for congestion, sinus pain and sore throat.   Eyes: Negative for blurred vision and pain.  Respiratory: Negative for cough and wheezing.   Cardiovascular: Negative for chest pain and leg swelling.  Gastrointestinal: Negative for abdominal pain, constipation, diarrhea, heartburn, nausea and vomiting.  Genitourinary: Negative for dysuria, frequency, hematuria and urgency.  Musculoskeletal: Negative for back pain, joint pain, myalgias and  neck pain.  Skin: Negative for itching and rash.  Neurological: Negative for dizziness, tremors and weakness.  Endo/Heme/Allergies: Does not bruise/bleed easily.  Psychiatric/Behavioral: Negative for depression. The patient is not nervous/anxious and does not have insomnia.     Objective: BP 120/80   Ht 5\' 6"  (1.676 m)   Wt 248 lb (112.5 kg)   BMI 40.03 kg/m   Filed Weights   07/25/20 1455  Weight: 248 lb (112.5 kg)   Physical Exam Constitutional:      General: She is not in acute distress.    Appearance: She is well-developed.  HENT:     Head: Normocephalic and atraumatic. No laceration.     Right Ear: Hearing normal.     Left Ear: Hearing normal.     Mouth/Throat:     Pharynx: Uvula midline.  Eyes:     Pupils: Pupils are equal, round, and reactive to light.  Neck:     Thyroid: No thyromegaly.  Cardiovascular:     Rate and Rhythm: Normal rate and regular rhythm.     Heart sounds: No murmur heard. No friction rub. No gallop.   Pulmonary:     Effort: Pulmonary effort is normal. No respiratory distress.     Breath sounds: Normal breath sounds. No wheezing.  Abdominal:     General: Bowel sounds are normal. There is no distension.     Palpations: Abdomen is  soft.     Tenderness: There is no abdominal tenderness. There is no rebound.  Musculoskeletal:        General: Normal range of motion.     Cervical back: Normal range of motion and neck supple.  Neurological:     Mental Status: She is alert and oriented to person, place, and time.     Cranial Nerves: No cranial nerve deficit.  Skin:    General: Skin is warm and dry.  Psychiatric:        Judgment: Judgment normal.  Vitals reviewed.     Assessment: 1. Admission for sterilization   Desires tubal for no more pregnancies All questions answered.  Desires surgery for tubal.  The patient has been fully informed about all methods of contraception, both temporary and permanent. She understands that tubal ligation is  meant to be permanent, absolute and irreversible. She was told that there is an approximately 1 in 400 chance of a pregnancy in the future after tubal ligation. She was told the short and long term complications of tubal ligation. She understands the risks from this surgery include, but are not limited to, the risks of anesthesia, hemorrhage, infection, perforation, and injury to adjacent structures, bowel, bladder and blood vessels.   Pt also doing well from a post partum standpoint.  Edinburgh 4, no s/sx PPD.  Infant doing well. Breast feeding, no concerns.  No bleeding, has not had period yet.  Paul Lempi Edwin, MD, FACOG [facility], [facility] 07/25/2020  3:04 PM  

## 2020-07-25 NOTE — H&P (View-Only) (Signed)
PRE-OPERATIVE HISTORY AND PHYSICAL EXAM  HPI:  Cynthia Mckay is a 40 y.o. [id number] No LMP recorded.; she is being admitted for surgery related to requested sterilization.  PMHx: Past Medical History:  Diagnosis Date  . Anemia   . Gestational diabetes    Past Surgical History:  Procedure Laterality Date  . GALLBLADDER SURGERY     Family History  Problem Relation Age of Onset  . Hypertension Mother   . Hypertension Father   . Depression Brother   . Renal Disease Paternal Grandmother   . Breast cancer Maternal Aunt    Social History   Tobacco Use  . Smoking status: Former Smoker    Packs/day: 0.50    Years: 15.00    Pack years: 7.50    Types: Cigarettes    Quit date: 03/09/2020    Years since quitting: 0.3  . Smokeless tobacco: Never Used  . Tobacco comment: No secondhand smoke exposure  Vaping Use  . Vaping Use: Never used  Substance Use Topics  . Alcohol use: Not Currently    Alcohol/week: 1.0 standard drink    Types: 1 Glasses of wine per week    Comment: last use- 2 mo ago  . Drug use: No    Current Outpatient Medications:  .  ferrous fumarate (HEMOCYTE - 106 MG FE) 325 (106 FE) MG TABS tablet, Take 1 tablet by mouth 2 (two) times daily., Disp: , Rfl:  .  Prenatal Vit-Fe Fumarate-FA (PREPLUS) 27-1 MG TABS, Take 1 tablet by mouth daily. (Patient not taking: No sig reported), Disp: 100 tablet, Rfl: 3 Allergies: Tramadol  Review of Systems  Constitutional: Negative for chills, fever and malaise/fatigue.  HENT: Negative for congestion, sinus pain and sore throat.   Eyes: Negative for blurred vision and pain.  Respiratory: Negative for cough and wheezing.   Cardiovascular: Negative for chest pain and leg swelling.  Gastrointestinal: Negative for abdominal pain, constipation, diarrhea, heartburn, nausea and vomiting.  Genitourinary: Negative for dysuria, frequency, hematuria and urgency.  Musculoskeletal: Negative for back pain, joint pain, myalgias and  neck pain.  Skin: Negative for itching and rash.  Neurological: Negative for dizziness, tremors and weakness.  Endo/Heme/Allergies: Does not bruise/bleed easily.  Psychiatric/Behavioral: Negative for depression. The patient is not nervous/anxious and does not have insomnia.     Objective: BP 120/80   Ht 5\' 6"  (1.676 m)   Wt 248 lb (112.5 kg)   BMI 40.03 kg/m   Filed Weights   07/25/20 1455  Weight: 248 lb (112.5 kg)   Physical Exam Constitutional:      General: She is not in acute distress.    Appearance: She is well-developed.  HENT:     Head: Normocephalic and atraumatic. No laceration.     Right Ear: Hearing normal.     Left Ear: Hearing normal.     Mouth/Throat:     Pharynx: Uvula midline.  Eyes:     Pupils: Pupils are equal, round, and reactive to light.  Neck:     Thyroid: No thyromegaly.  Cardiovascular:     Rate and Rhythm: Normal rate and regular rhythm.     Heart sounds: No murmur heard. No friction rub. No gallop.   Pulmonary:     Effort: Pulmonary effort is normal. No respiratory distress.     Breath sounds: Normal breath sounds. No wheezing.  Abdominal:     General: Bowel sounds are normal. There is no distension.     Palpations: Abdomen is  soft.     Tenderness: There is no abdominal tenderness. There is no rebound.  Musculoskeletal:        General: Normal range of motion.     Cervical back: Normal range of motion and neck supple.  Neurological:     Mental Status: She is alert and oriented to person, place, and time.     Cranial Nerves: No cranial nerve deficit.  Skin:    General: Skin is warm and dry.  Psychiatric:        Judgment: Judgment normal.  Vitals reviewed.     Assessment: 1. Admission for sterilization   Desires tubal for no more pregnancies All questions answered.  Desires surgery for tubal.  The patient has been fully informed about all methods of contraception, both temporary and permanent. She understands that tubal ligation is  meant to be permanent, absolute and irreversible. She was told that there is an approximately 1 in 400 chance of a pregnancy in the future after tubal ligation. She was told the short and long term complications of tubal ligation. She understands the risks from this surgery include, but are not limited to, the risks of anesthesia, hemorrhage, infection, perforation, and injury to adjacent structures, bowel, bladder and blood vessels.   Pt also doing well from a post partum standpoint.  Edinburgh 4, no s/sx PPD.  Infant doing well. Breast feeding, no concerns.  No bleeding, has not had period yet.  Annamarie Major, MD, [facility], [facility] 07/25/2020  3:04 PM

## 2020-07-25 NOTE — Patient Instructions (Signed)
Laparoscopic Tubal Ligation Laparoscopic tubal ligation is a procedure to close the fallopian tubes. This is done to prevent pregnancy. When the fallopian tubes are closed, the eggs that your ovaries release cannot enter the uterus, and sperm cannot reach the released eggs. You should not have this procedure if you want to get pregnant someday or if you are unsure about having more children. Tell a health care provider about:  Any allergies you have.  All medicines you are taking, including vitamins, herbs, eye drops, creams, and over-the-counter medicines.  Any problems you or family members have had with anesthetic medicines.  Any blood disorders you have.  Any surgeries you have had.  Any medical conditions you have.  Whether you are pregnant or may be pregnant.  Any past pregnancies. What are the risks? Generally, this is a safe procedure. However, problems may occur, including:  Infection.  Bleeding.  Injury to other organs in the abdomen.  Side effects from anesthetic medicines.  Failure of the procedure. This procedure can increase your risk of an ectopic pregnancy. This is a pregnancy in which a fertilized egg attaches to the outside of the uterus. What happens before the procedure? Staying hydrated Follow instructions from your health care provider about hydration, which may include:  Up to 2 hours before the procedure - you may continue to drink clear liquids, such as water, clear fruit juice, black coffee, and plain tea. Eating and drinking restrictions Follow instructions from your health care provider about eating and drinking, which may include:  8 hours before the procedure - stop eating heavy meals or foods, such as meat, fried foods, or fatty foods.  6 hours before the procedure - stop eating light meals or foods, such as toast or cereal.  6 hours before the procedure - stop drinking milk or drinks that contain milk.  2 hours before the procedure - stop  drinking clear liquids. Medicines Ask your health care provider about:  Changing or stopping your regular medicines. This is especially important if you are taking diabetes medicines or blood thinners.  Taking medicines such as aspirin and ibuprofen. These medicines can thin your blood. Do not take these medicines unless your health care provider tells you to take them.  Taking over-the-counter medicines, vitamins, herbs, and supplements. Surgery safety Ask your health care provider:  How your surgery site will be marked.  What steps will be taken to help prevent infection. These steps may include: ? Removing hair at the surgery site. ? Washing skin with a germ-killing soap. ? Taking antibiotic medicine. General instructions  Do not use any products that contain nicotine or tobacco for at least 4 weeks before the procedure. These products include cigarettes, chewing tobacco, and vaping devices, such as e-cigarettes. If you need help quitting, ask your health care provider.  Plan to have someone take you home from the hospital.  If you will be going home right after the procedure, plan to have a responsible adult care for you for the time you are told. This is important. What happens during the procedure?  An IV will be inserted into one of your veins.  You will be given one or more of the following: ? A medicine to help you relax (sedative). ? A medicine to numb the area (local anesthetic). ? A medicine to make you fall asleep (general anesthetic). ? A medicine that is injected into an area of your body to numb everything below the injection site (regional anesthetic).  Your bladder   may be emptied with a small tube (catheter).  If you have been given a general anesthetic, a tube will be put down your throat to help you breathe.  Two small incisions will be made in your lower abdomen and near your belly button.  Your abdomen will be inflated with a gas. This will let the  surgeon see better and will give the surgeon room to work.  A lighted tube with camera (laparoscope) will be inserted into your abdomen through one of the incisions. Small instruments will be inserted through the other incision.  The fallopian tubes will be tied off, burned (cauterized), or blocked with a clip, ring, or clamp. A small portion in the center of each fallopian tube may be removed.  The gas will be released from the abdomen.  The incisions will be closed with stitches (sutures).  A bandage (dressing) will be placed over the incisions. The procedure may vary among health care providers and hospitals.      What happens after the procedure?  Your blood pressure, heart rate, breathing rate, and blood oxygen level will be monitored until you leave the hospital.  You will be given medicine to help with pain, nausea, and vomiting as needed.  You may have vaginal discharge after the procedure. You may need to wear a sanitary napkin.  If you were given a sedative during the procedure, it can affect you for several hours. Do not drive or operate machinery until your health care provider says that it is safe. Summary  Laparoscopic tubal ligation is a procedure that is done to prevent pregnancy.  You should not have this procedure if you want to get pregnant someday or if you are unsure about having more children.  The procedure is done using a thin, lighted tube (laparoscope) with a camera attached that will be inserted into your abdomen through an incision.  After the procedure you will be given medicine to help with pain, nausea, and vomiting as needed.  Plan to have someone take you home from the hospital. This information is not intended to replace advice given to you by your health care provider. Make sure you discuss any questions you have with your health care provider. Document Revised: 11/10/2019 Document Reviewed: 11/10/2019 Elsevier Patient Education  2021 Elsevier  Inc.  

## 2020-07-29 ENCOUNTER — Encounter
Admission: RE | Admit: 2020-07-29 | Discharge: 2020-07-29 | Disposition: A | Discharge status: Home / Self Care | Payer: Medicaid Other | Source: Ambulatory Visit | Attending: Obstetrics & Gynecology | Admitting: Obstetrics & Gynecology

## 2020-07-29 ENCOUNTER — Other Ambulatory Visit: Payer: Self-pay

## 2020-07-29 HISTORY — DX: Calculus in bladder: N21.0

## 2020-07-29 HISTORY — DX: Hypothyroidism, unspecified: E03.9

## 2020-07-29 NOTE — Patient Instructions (Signed)
Your procedure is scheduled on:07-30-20 TUESDAY Report to the Registration Desk on the 1st floor of the [facility]-Then proceed to the 2nd floor Surgery Desk To find out your arrival time, please call [phone] between 1PM - 3PM on:07-29-20 MONDAY  REMEMBER: Instructions that are not followed completely may result in serious medical risk, up to and including death; or upon the discretion of your surgeon and anesthesiologist your surgery may need to be rescheduled.  Do not eat food after midnight the night before surgery.  No gum chewing, lozengers or hard candies.  You may however, drink CLEAR liquids up to 2 hours before you are scheduled to arrive for your surgery. Do not drink anything within 2 hours of your scheduled arrival time.  Clear liquids include: - water  - apple juice without pulp - gatorade  - black coffee or tea (Do NOT add milk or creamers to the coffee or tea) Do NOT drink anything that is not on this list.  DO NOT TAKE ANY MEDICATION THE DAY OF SURGERY  One week prior to surgery: Stop Anti-inflammatories (NSAIDS) such as Advil, Aleve, Ibuprofen, Motrin, Naproxen, Naprosyn and Aspirin based products such as Excedrin, Goodys Powder, BC Powder-OK TO TAKE TYLENOL IF NEEDED  No Alcohol for 24 hours before or after surgery.  No Smoking including e-cigarettes for 24 hours prior to surgery.  No chewable tobacco products for at least 6 hours prior to surgery.  No nicotine patches on the day of surgery.  Do not use any "recreational" drugs for at least a week prior to your surgery.  Please be advised that the combination of cocaine and anesthesia may have negative outcomes, up to and including death. If you test positive for cocaine, your surgery will be cancelled.  On the morning of surgery brush your teeth with toothpaste and water, you may rinse your mouth with mouthwash if you wish. Do not swallow any toothpaste or mouthwash.  Do not wear jewelry, make-up,  hairpins, clips or nail polish.  Do not wear lotions, powders, or perfumes.   Do not shave body from the neck down 48 hours prior to surgery just in case you cut yourself which could leave a site for infection.  Also, freshly shaved skin may become irritated if using the CHG soap.  Contact lenses, hearing aids and dentures may not be worn into surgery.  Do not bring valuables to the hospital. [facility] is not responsible for any missing/lost belongings or valuables.   Notify your doctor if there is any change in your medical condition (cold, fever, infection).  Wear comfortable clothing (specific to your surgery type) to the hospital.  Plan for stool softeners for home use; pain medications have a tendency to cause constipation. You can also help prevent constipation by eating foods high in fiber such as fruits and vegetables and drinking plenty of fluids as your diet allows.  After surgery, you can help prevent lung complications by doing breathing exercises.  Take deep breaths and cough every 1-2 hours. Your doctor may order a device called an Incentive Spirometer to help you take deep breaths. When coughing or sneezing, hold a pillow firmly against your incision with both hands. This is called "splinting." Doing this helps protect your incision. It also decreases belly discomfort.  If you are being admitted to the hospital overnight, leave your suitcase in the car. After surgery it may be brought to your room.  If you are being discharged the day of surgery, you will  not be allowed to drive home. You will need a responsible adult (18 years or older) to drive you home and stay with you that night.   If you are taking public transportation, you will need to have a responsible adult (18 years or older) with you. Please confirm with your physician that it is acceptable to use public transportation.   Please call the Pre-admissions Testing Dept. at [phone] if you have any  questions about these instructions.  Surgery Visitation Policy:  Patients undergoing a surgery or procedure may have one family member or support person with them as long as that person is not COVID-19 positive or experiencing its symptoms.  That person may remain in the waiting area during the procedure.  Inpatient Visitation:    Visiting hours are 7 a.m. to 8 p.m. Inpatients will be allowed two visitors daily. The visitors may change each day during the patient's stay. No visitors under the age of 74. Any visitor under the age of 5 must be accompanied by an adult. The visitor must pass COVID-19 screenings, use hand sanitizer when entering and exiting the patient's room and wear a mask at all times, including in the patient's room. Patients must also wear a mask when staff or their visitor are in the room. Masking is required regardless of vaccination status.

## 2020-07-30 ENCOUNTER — Ambulatory Visit
Admission: RE | Admit: 2020-07-30 | Discharge: 2020-07-30 | Disposition: A | Discharge status: Home / Self Care | Payer: Medicaid Other | Attending: Obstetrics & Gynecology | Admitting: Obstetrics & Gynecology

## 2020-07-30 ENCOUNTER — Ambulatory Visit: Payer: Medicaid Other | Admitting: Anesthesiology

## 2020-07-30 ENCOUNTER — Encounter: Payer: Self-pay | Admitting: Obstetrics & Gynecology

## 2020-07-30 ENCOUNTER — Encounter
Admission: RE | Disposition: A | Discharge status: Home / Self Care | Payer: Self-pay | Source: Home / Self Care | Attending: Obstetrics & Gynecology

## 2020-07-30 DIAGNOSIS — Z888 Allergy status to other drugs, medicaments and biological substances status: Secondary | ICD-10-CM | POA: Diagnosis not present

## 2020-07-30 DIAGNOSIS — Z885 Allergy status to narcotic agent status: Secondary | ICD-10-CM | POA: Insufficient documentation

## 2020-07-30 DIAGNOSIS — Z87891 Personal history of nicotine dependence: Secondary | ICD-10-CM | POA: Insufficient documentation

## 2020-07-30 DIAGNOSIS — Z302 Encounter for sterilization: Secondary | ICD-10-CM | POA: Diagnosis not present

## 2020-07-30 HISTORY — PX: LAPAROSCOPIC TUBAL LIGATION: SHX1937

## 2020-07-30 LAB — CBC
HCT: 36.3 % (ref 36.0–46.0)
Hemoglobin: 12.2 g/dL (ref 12.0–15.0)
MCH: 30.2 pg (ref 26.0–34.0)
MCHC: 33.6 g/dL (ref 30.0–36.0)
MCV: 89.9 fL (ref 80.0–100.0)
Platelets: 169 10*3/uL (ref 150–400)
RBC: 4.04 MIL/uL (ref 3.87–5.11)
RDW: 13.6 % (ref 11.5–15.5)
WBC: 6.9 10*3/uL (ref 4.0–10.5)
nRBC: 0 % (ref 0.0–0.2)

## 2020-07-30 LAB — TYPE AND SCREEN
ABO/RH(D): A POS
Antibody Screen: NEGATIVE

## 2020-07-30 LAB — POCT PREGNANCY, URINE: Preg Test, Ur: NEGATIVE

## 2020-07-30 SURGERY — LIGATION, FALLOPIAN TUBE, LAPAROSCOPIC
Anesthesia: General | Laterality: Bilateral

## 2020-07-30 MED ORDER — SUCCINYLCHOLINE CHLORIDE 20 MG/ML IJ SOLN
INTRAMUSCULAR | Status: DC | PRN
  Administered 2020-07-30: 100 mg via INTRAVENOUS

## 2020-07-30 MED ORDER — ACETAMINOPHEN 650 MG RE SUPP
650.0000 mg | RECTAL | Status: DC | PRN
  Filled 2020-07-30: qty 1

## 2020-07-30 MED ORDER — FENTANYL CITRATE (PF) 100 MCG/2ML IJ SOLN
INTRAMUSCULAR | Status: DC | PRN
  Administered 2020-07-30 (×2): 50 ug via INTRAVENOUS

## 2020-07-30 MED ORDER — MIDAZOLAM HCL 2 MG/2ML IJ SOLN
INTRAMUSCULAR | Status: AC
  Filled 2020-07-30: qty 2

## 2020-07-30 MED ORDER — ONDANSETRON HCL 4 MG/2ML IJ SOLN: 4.0000 mg | Freq: Once | INTRAMUSCULAR | Status: DC | PRN

## 2020-07-30 MED ORDER — OXYCODONE-ACETAMINOPHEN 5-325 MG PO TABS
ORAL_TABLET | ORAL | Status: AC
  Administered 2020-07-30: 1 via ORAL
  Filled 2020-07-30: qty 1

## 2020-07-30 MED ORDER — OXYCODONE-ACETAMINOPHEN 5-325 MG PO TABS: 1.0000 | ORAL_TABLET | ORAL | 0 refills | Status: DC | PRN

## 2020-07-30 MED ORDER — MORPHINE SULFATE (PF) 2 MG/ML IV SOLN: 1.0000 mg | INTRAVENOUS | Status: DC | PRN

## 2020-07-30 MED ORDER — PROPOFOL 10 MG/ML IV BOLUS
INTRAVENOUS | Status: AC
  Filled 2020-07-30: qty 20

## 2020-07-30 MED ORDER — BUPIVACAINE HCL 0.5 % IJ SOLN
INTRAMUSCULAR | Status: DC | PRN
  Administered 2020-07-30: 10 mL

## 2020-07-30 MED ORDER — FENTANYL CITRATE (PF) 100 MCG/2ML IJ SOLN
25.0000 ug | INTRAMUSCULAR | Status: DC | PRN
  Administered 2020-07-30 (×2): 25 ug via INTRAVENOUS

## 2020-07-30 MED ORDER — FENTANYL CITRATE (PF) 100 MCG/2ML IJ SOLN
INTRAMUSCULAR | Status: AC
  Filled 2020-07-30: qty 2

## 2020-07-30 MED ORDER — OXYCODONE-ACETAMINOPHEN 5-325 MG PO TABS: 1.0000 | ORAL_TABLET | ORAL | Status: DC | PRN

## 2020-07-30 MED ORDER — DEXAMETHASONE SODIUM PHOSPHATE 10 MG/ML IJ SOLN
INTRAMUSCULAR | Status: DC | PRN
  Administered 2020-07-30: 5 mg via INTRAVENOUS

## 2020-07-30 MED ORDER — LIDOCAINE HCL (PF) 2 % IJ SOLN
INTRAMUSCULAR | Status: AC
  Filled 2020-07-30: qty 4

## 2020-07-30 MED ORDER — CHLORHEXIDINE GLUCONATE 0.12 % MT SOLN: 15.0000 mL | Freq: Once | OROMUCOSAL | Status: AC

## 2020-07-30 MED ORDER — MIDAZOLAM HCL 2 MG/2ML IJ SOLN
INTRAMUSCULAR | Status: DC | PRN
  Administered 2020-07-30: 2 mg via INTRAVENOUS

## 2020-07-30 MED ORDER — FAMOTIDINE 20 MG PO TABS: 20.0000 mg | ORAL_TABLET | Freq: Once | ORAL | Status: AC

## 2020-07-30 MED ORDER — POVIDONE-IODINE 10 % EX SWAB: 2.0000 "application " | Freq: Once | CUTANEOUS | Status: DC

## 2020-07-30 MED ORDER — CHLORHEXIDINE GLUCONATE 0.12 % MT SOLN
OROMUCOSAL | Status: AC
  Administered 2020-07-30: 15 mL via OROMUCOSAL
  Filled 2020-07-30: qty 15

## 2020-07-30 MED ORDER — FENTANYL CITRATE (PF) 100 MCG/2ML IJ SOLN
INTRAMUSCULAR | Status: AC
  Administered 2020-07-30: 25 ug via INTRAVENOUS
  Filled 2020-07-30: qty 2

## 2020-07-30 MED ORDER — PROPOFOL 10 MG/ML IV BOLUS
INTRAVENOUS | Status: DC | PRN
  Administered 2020-07-30: 150 mg via INTRAVENOUS

## 2020-07-30 MED ORDER — SUGAMMADEX SODIUM 200 MG/2ML IV SOLN
INTRAVENOUS | Status: DC | PRN
  Administered 2020-07-30: 240 mg via INTRAVENOUS

## 2020-07-30 MED ORDER — LACTATED RINGERS IV SOLN: INTRAVENOUS | Status: DC

## 2020-07-30 MED ORDER — ONDANSETRON HCL 4 MG/2ML IJ SOLN
INTRAMUSCULAR | Status: DC | PRN
  Administered 2020-07-30: 4 mg via INTRAVENOUS

## 2020-07-30 MED ORDER — LIDOCAINE HCL (CARDIAC) PF 100 MG/5ML IV SOSY
PREFILLED_SYRINGE | INTRAVENOUS | Status: DC | PRN
  Administered 2020-07-30: 80 mg via INTRAVENOUS

## 2020-07-30 MED ORDER — BUPIVACAINE HCL (PF) 0.5 % IJ SOLN
INTRAMUSCULAR | Status: AC
  Filled 2020-07-30: qty 30

## 2020-07-30 MED ORDER — ORAL CARE MOUTH RINSE: 15.0000 mL | Freq: Once | OROMUCOSAL | Status: AC

## 2020-07-30 MED ORDER — ROCURONIUM BROMIDE 100 MG/10ML IV SOLN
INTRAVENOUS | Status: DC | PRN
  Administered 2020-07-30: 30 mg via INTRAVENOUS

## 2020-07-30 MED ORDER — FAMOTIDINE 20 MG PO TABS
ORAL_TABLET | ORAL | Status: AC
  Administered 2020-07-30: 20 mg via ORAL
  Filled 2020-07-30: qty 1

## 2020-07-30 MED ORDER — ACETAMINOPHEN 325 MG PO TABS: 650.0000 mg | ORAL_TABLET | ORAL | Status: DC | PRN

## 2020-07-30 SURGICAL SUPPLY — 34 items
ADH SKN CLS APL DERMABOND .7 (GAUZE/BANDAGES/DRESSINGS) ×1
APL PRP STRL LF DISP 70% ISPRP (MISCELLANEOUS) ×1
BAG COUNTER SPONGE EZ (MISCELLANEOUS) ×2 IMPLANT
BAG SPNG 4X4 CLR HAZ (MISCELLANEOUS) ×1
BLADE SURG SZ11 CARB STEEL (BLADE) ×2 IMPLANT
CANISTER SUCT 1200ML W/VALVE (MISCELLANEOUS) IMPLANT
CATH ROBINSON RED A/P 16FR (CATHETERS) ×2 IMPLANT
CHLORAPREP W/TINT 26 (MISCELLANEOUS) ×2 IMPLANT
COVER WAND RF STERILE (DRAPES) IMPLANT
DERMABOND ADVANCED (GAUZE/BANDAGES/DRESSINGS) ×1
DERMABOND ADVANCED .7 DNX12 (GAUZE/BANDAGES/DRESSINGS) ×1 IMPLANT
DRSG TEGADERM 2-3/8X2-3/4 SM (GAUZE/BANDAGES/DRESSINGS) ×4 IMPLANT
GLOVE SURG ENC MOIS LTX SZ8 (GLOVE) ×4 IMPLANT
GLOVE SURG UNDER LTX SZ8 (GLOVE) ×2 IMPLANT
GOWN STRL REUS W/ TWL LRG LVL3 (GOWN DISPOSABLE) ×1 IMPLANT
GOWN STRL REUS W/ TWL XL LVL3 (GOWN DISPOSABLE) ×1 IMPLANT
GOWN STRL REUS W/TWL LRG LVL3 (GOWN DISPOSABLE) ×2
GOWN STRL REUS W/TWL XL LVL3 (GOWN DISPOSABLE) ×2
LABEL OR SOLS (LABEL) ×2 IMPLANT
MANIFOLD NEPTUNE II (INSTRUMENTS) ×2 IMPLANT
NEEDLE VERESS 14GA 120MM (NEEDLE) ×2 IMPLANT
NS IRRIG 500ML POUR BTL (IV SOLUTION) ×2 IMPLANT
PACK GYN LAPAROSCOPIC (MISCELLANEOUS) ×2 IMPLANT
PAD OB MATERNITY 4.3X12.25 (PERSONAL CARE ITEMS) ×2 IMPLANT
PAD PREP 24X41 OB/GYN DISP (PERSONAL CARE ITEMS) ×2 IMPLANT
SET TUBE SMOKE EVAC HIGH FLOW (TUBING) ×2 IMPLANT
SHEARS HARMONIC ACE PLUS 36CM (ENDOMECHANICALS) ×2 IMPLANT
SLEEVE ENDOPATH XCEL 5M (ENDOMECHANICALS) ×2 IMPLANT
SPONGE GAUZE 2X2 8PLY STRL LF (GAUZE/BANDAGES/DRESSINGS) ×4 IMPLANT
SUT VIC AB 2-0 UR6 27 (SUTURE) ×2 IMPLANT
SUT VIC AB 4-0 PS2 18 (SUTURE) ×4 IMPLANT
SYR 10ML LL (SYRINGE) ×4 IMPLANT
TROCAR ENDO BLADELESS 11MM (ENDOMECHANICALS) IMPLANT
TROCAR XCEL NON-BLD 5MMX100MML (ENDOMECHANICALS) ×2 IMPLANT

## 2020-07-30 NOTE — Anesthesia Postprocedure Evaluation (Signed)
Anesthesia Post Note  Patient: Cynthia Mckay  Procedure(s) Performed: LAPAROSCOPY WITH TUBAL PARTIAL SALPINGECTOMY (Bilateral )  Patient location during evaluation: PACU Anesthesia Type: General Level of consciousness: awake and alert and oriented Pain management: pain level controlled Vital Signs Assessment: post-procedure vital signs reviewed and stable Respiratory status: spontaneous breathing Cardiovascular status: blood pressure returned to baseline Anesthetic complications: no   No complications documented.   Last Vitals:  Vitals:   07/30/20 1338 07/30/20 1359  BP: (!) 121/58 113/65  Pulse: 66 66  Resp: 18 18  Temp: (!) 35.9 C   SpO2: 99% 97%    Last Pain:  Vitals:   07/30/20 1359  TempSrc:   PainSc: 1                  Tildon Silveria

## 2020-07-30 NOTE — Anesthesia Preprocedure Evaluation (Signed)
Anesthesia Evaluation  Patient identified by MRN, date of birth, ID band Patient awake    Reviewed: Allergy & Precautions, NPO status , Patient's Chart, lab work & pertinent test results  Airway Mallampati: II  TM Distance: >3 FB     Dental  (+) Teeth Intact   Pulmonary neg pulmonary ROS, former smoker,    Pulmonary exam normal        Cardiovascular negative cardio ROS Normal cardiovascular exam     Neuro/Psych negative neurological ROS  negative psych ROS   GI/Hepatic negative GI ROS, Neg liver ROS,   Endo/Other  diabetes, GestationalHypothyroidism   Renal/GU stones     Musculoskeletal negative musculoskeletal ROS (+)   Abdominal Normal abdominal exam  (+)   Peds negative pediatric ROS (+)  Hematology  (+) anemia ,   Anesthesia Other Findings Past Medical History: No date: Anemia No date: Bladder stones No date: Gestational diabetes No date: Hypothyroidism  Reproductive/Obstetrics                             Anesthesia Physical Anesthesia Plan  ASA: II  Anesthesia Plan: General   Post-op Pain Management:    Induction: Intravenous  PONV Risk Score and Plan:   Airway Management Planned: Oral ETT  Additional Equipment:   Intra-op Plan:   Post-operative Plan: Extubation in OR  Informed Consent: I have reviewed the patients History and Physical, chart, labs and discussed the procedure including the risks, benefits and alternatives for the proposed anesthesia with the patient or authorized representative who has indicated his/her understanding and acceptance.     Dental advisory given  Plan Discussed with: CRNA and Surgeon  Anesthesia Plan Comments:         Anesthesia Quick Evaluation

## 2020-07-30 NOTE — Discharge Instructions (Signed)
Laparoscopic Tubal Ligation, Care After The following information offers guidance on how to care for yourself after your procedure. Your health care provider may also give you more specific instructions. If you have problems or questions, contact your health care provider. What can I expect after the procedure? After the procedure, it is common to have:  A sore throat if general anesthesia was used.  Pain in shoulders, back, and abdomen. This is caused by the gas that was used during the procedure.  Mild discomfort or cramping in your abdomen.  Pain or soreness in the area where the surgical incision was made.  A bloated feeling.  Tiredness.  Nausea and vomiting. Follow these instructions at home: Medicines  Take over-the-counter and prescription medicines only as told by your health care provider.  Ask your health care provider if the medicine prescribed to you: ? Requires you to avoid driving or using heavy machinery. ? Can cause constipation. You may need to take these actions to prevent or treat constipation:  Drink enough fluid to keep your urine pale yellow.  Take over-the-counter or prescription medicines.  Eat foods that are high in fiber, such as beans, whole grains, and fresh fruits and vegetables.  Limit foods that are high in fat and processed sugars, such as fried or sweet foods.  Do not take aspirin because it can cause bleeding. Incision care  Follow instructions from your health care provider about how to take care of your incision. Make sure you: ? Wash your hands with soap and water for at least 20 seconds before and after you change your bandage (dressing). If soap and water are not available, use hand sanitizer. ? Change your dressing as told by your health care provider. ? Leave stitches (sutures), skin glue, or adhesive strips in place. These skin closures may need to stay in place for 2 weeks or longer. If adhesive strip edges start to loosen and curl  up, you may trim the loose edges. Do not remove adhesive strips completely unless your health care provider tells you to do that.  Check your incision area every day for signs of infection. Check for: ? Redness, swelling, or more pain. ? Fluid or blood. ? Warmth. ? Pus or a bad smell.      Activity  Rest as told by your health care provider.  Avoid sitting for a long time without moving. Get up to take short walks every 1-2 hours. This is important to improve blood flow and breathing. Ask for help if you feel weak or unsteady.  Do not have sex, douche, or put a tampon or anything else in your vagina for 6 weeks or as long as told by your health care provider.  Do not lift anything that is heavier than your baby for 2 weeks, or the limit that you are told, until your health care provider says that it is safe.  Do not take baths, swim, or use a hot tub until your health care provider approves. Ask your health care provider if you may take showers. You may only be allowed to take sponge baths.  Return to your normal activities as told by your health care provider. Ask your health care provider what activities are safe for you. General instructions  After the procedure you may need to wear a sanitary pad for vaginal discharge.  Have someone help you with your daily household tasks for the first few days.  Keep all follow-up visits. This is important. Contact a health   care provider if:  You have redness, swelling, or more pain around your incision.  Your incision feels warm to the touch.  You have pus or a bad smell coming from your incision.  The edges of your incision break open after the sutures have been removed.  Your pain does not improve after 2-3 days.  You have a rash.  You repeatedly become dizzy or light-headed.  Your pain medicine is not helping. Get help right away if:  You have a fever or chills.  You faint.  You have increasing pain in your  abdomen.  You have severe pain in one or both of your shoulders.  You have fluid or blood coming from your sutures or heavy bleeding from your vagina.  You have shortness of breath or difficulty breathing.  You have chest pain, leg pain, or leg swelling.  You have ongoing nausea, vomiting, or diarrhea. These symptoms may represent a serious problem that is an emergency. Do not wait to see if the symptoms will go away. Get medical help right away. Call your local emergency services (911 in the [place] Do not drive yourself to the hospital. Summary  After the procedure, it is common to have mild discomfort or cramping in your abdomen.  After the procedure you may need to wear a sanitary pad for vaginal discharge.  Take over-the-counter and prescription medicines only as told by your health care provider.  Watch for symptoms that should prompt you to call your health care provider.  Keep all follow-up visits. This is important. This information is not intended to replace advice given to you by your health care provider. Make sure you discuss any questions you have with your health care provider. Document Revised: 11/10/2019 Document Reviewed: 11/10/2019 Elsevier Patient Education  2021 Elsevier Inc.     CIRUGIA AMBULATORIA       Instruccionnes de alta    Date Franco Nones) 07/30/20   1.  Las drogas que se Dispensing optician en su cuerpo [employer], asi            que por las proximas 24 horas usted no debe:   Conducir Field seismologist) un automovil   Hacer ninguna decision legal   Tomar ninguna bebida alcoholica  2.  A) [place] puede comenzar una dieta regular.  Es mejor que hoy empiece con           liquidos y gradualmente anada [address]       B) Puede comer cualquier comida que desee pero es mejor empezar con liquidos,                      luego sopitas con galletas saladas y gradualmente llegar a las comidas solidas.  3.  Por favor avise a su medico inmediatamente  si usted tiene algun sangrado anormal,       tiene dificultad con la respiracion, enrojecimiento y Engineer, mining en el sitio de la [place], [place],       fiebro o dolor que se alivia con [place].  4.  A) Su visita posoperatoria (despues de su operacion) es con el  Dr.   Date                  Time         B)  Por favor llame para hacer la cita posoperatoria.  5.  Istrucciones especificas :

## 2020-07-30 NOTE — Interval H&P Note (Signed)
History and Physical Interval Note:  07/30/2020 9:25 AM  Cynthia Mckay  has presented today for surgery, with the diagnosis of Sterilization admission.  The various methods of treatment have been discussed with the patient and family. After consideration of risks, benefits and other options for treatment, the patient has consented to  Procedure(s): LAPAROSCOPY WITH TUBAL PARTIAL SALPINGECTOMY (Bilateral) as a surgical intervention.  The patient's history has been reviewed, patient examined, no change in status, stable for surgery.  I have reviewed the patient's chart and labs.  Questions were answered to the patient's satisfaction.     Letitia Libra

## 2020-07-30 NOTE — Op Note (Signed)
  Operative Note   07/30/2020  PRE-OP DIAGNOSIS: Desire for permanent sterilization  POST-OP DIAGNOSIS: same   PROCEDURE: Procedure(s): LAPAROSCOPY WITH TUBAL PARTIAL SALPINGECTOMY   SURGEON: Annamarie Major, MD, FACOG  ANESTHESIA: Choice   ESTIMATED BLOOD LOSS: Min  COMPLICATIONS: None  DISPOSITION: PACU - hemodynamically stable.  CONDITION: stable  FINDINGS: Laparoscopic survey of the abdomen revealed a grossly normal uterus, tubes, ovaries, liver edge, gallbladder edge and appendix, No intra-abdominal adhesions were noted.  PROCEDURE IN DETAIL: The patient was taken to the OR where anesthesia was administed. The patient was positioned in dorsal lithotomy in the [place] stirrups. The patient was then examined under anesthesia with the above noted findings. The patient was prepped and draped in the normal sterile fashion and bladder was drained using a red rubber cathater. Speculum exam normal, and a sponge stick was placed for manipulation purposes.  Attention was turned to the patient's abdomen where a 5 mm skin incision was made in the umbilical fold, after injection of local anesthesia. The Veress step needle was carefully introduced into the peritoneal cavity with placement confirmed using the hanging drop technique.  Pneumoperitoneum was obtained. The 5 mm port was then placed under direct visualization with the operative laparoscope  The above noted findings.  Trendelenburg.  A 5 mm trocar was then placed in the right lower quadrant under direct visualization with the laparoscope.  Right and left fallopian tubes are identified and followed out to their fimbria.  Each tube is excised utilizing the Harmonic scapel to include the fibria.  No injuries or bleeding was noted.  All instruments and ports were then removed from the abdomen after gas was expelled and patient was leveled.   The skin was closed with skin adhesive. The patient tolerated the procedure well. All counts were correct  x 2. The patient was transferred to the recovery room awake, alert and breathing independently.  Annamarie Major, MD, [facility], [facility] 07/30/2020  11:08 AM

## 2020-07-30 NOTE — Anesthesia Procedure Notes (Signed)
Procedure Name: Intubation Performed by: Fredderick Phenix, CRNA Pre-anesthesia Checklist: Patient identified, Emergency Drugs available, Suction available and Patient being monitored Patient Re-evaluated:Patient Re-evaluated prior to induction Oxygen Delivery Method: Circle system utilized Preoxygenation: Pre-oxygenation with 100% oxygen Induction Type: IV induction Ventilation: Mask ventilation without difficulty Laryngoscope Size: Mac and 4 Grade View: Grade I Tube type: Oral Number of attempts: 1 Airway Equipment and Method: Stylet and Oral airway Placement Confirmation: ETT inserted through vocal cords under direct vision,  positive ETCO2 and breath sounds checked- equal and bilateral Secured at: 21 cm Tube secured with: Tape Dental Injury: Teeth and Oropharynx as per pre-operative assessment

## 2020-07-30 NOTE — Transfer of Care (Signed)
Immediate Anesthesia Transfer of Care Note  Patient: Cynthia Mckay  Procedure(s) Performed: LAPAROSCOPY WITH TUBAL PARTIAL SALPINGECTOMY (Bilateral )  Patient Location: PACU  Anesthesia Type:General  Level of Consciousness: awake and alert   Airway & Oxygen Therapy: Patient Spontanous Breathing and Patient connected to nasal cannula oxygen  Post-op Assessment: Report given to RN and Post -op Vital signs reviewed and stable  Post vital signs: Reviewed and stable  Last Vitals:  Vitals Value Taken Time  BP 117/66 07/30/20 1115  Temp    Pulse 60 07/30/20 1116  Resp 15 07/30/20 1116  SpO2 99 % 07/30/20 1116  Vitals shown include unvalidated device data.  Last Pain: There were no vitals filed for this visit.       Complications: No complications documented.

## 2020-07-31 ENCOUNTER — Encounter: Payer: Self-pay | Admitting: Obstetrics & Gynecology

## 2020-07-31 LAB — SURGICAL PATHOLOGY

## 2020-07-31 NOTE — Addendum Note (Signed)
Addended by: Heywood Bene on: 07/31/2020 02:55 PM   Modules accepted: Orders

## 2020-07-31 NOTE — Addendum Note (Signed)
Addended by: Heywood Bene on: 07/31/2020 02:36 PM   Modules accepted: Orders

## 2020-08-12 ENCOUNTER — Ambulatory Visit: Payer: Medicaid Other | Admitting: Obstetrics & Gynecology

## 2020-09-06 ENCOUNTER — Other Ambulatory Visit: Payer: Self-pay | Admitting: Physician Assistant

## 2020-09-06 DIAGNOSIS — R102 Pelvic and perineal pain: Secondary | ICD-10-CM

## 2020-09-20 ENCOUNTER — Telehealth: Payer: Self-pay

## 2020-09-20 NOTE — Telephone Encounter (Signed)
TC to patient home phone to schedule PP appointment. LM with number to call. TC to cell phone and that phone "not accepting calls at this time". Patient had BTL.Marland KitchenBurt Knack, RN

## 2020-09-23 ENCOUNTER — Telehealth: Payer: Self-pay

## 2020-09-23 NOTE — Telephone Encounter (Signed)
Call to client with Marlene Yemen to schedule post-partum appt. Left message to call with number to call provided. Roddie Mc Yemen assisted with call. Jossie Ng, RN

## 2020-09-23 NOTE — Telephone Encounter (Signed)
Call to client to schedule post-partum appt. Left message to call with number to call provided. Roddie Mc Yemen interpreted during call. Jossie Ng, RN

## 2020-09-26 NOTE — Telephone Encounter (Signed)
Phone call to pt with intperpreter Marlene Yemen. Left message on voicemail that RN with [facility] is calling to schedule PP appt. Please call [phone] for PP appt.

## 2020-10-01 NOTE — Telephone Encounter (Signed)
Call to client to schedule post-partum appt. Client desires early pm appt on 10/07/2020 and to arrive at 1245. Client reports had BTL following delivery. Jossie Ng, RN

## 2020-10-07 ENCOUNTER — Ambulatory Visit: Payer: Self-pay

## 2020-12-04 ENCOUNTER — Encounter: Payer: Self-pay | Admitting: Advanced Practice Midwife

## 2020-12-04 DIAGNOSIS — Z9851 Tubal ligation status: Secondary | ICD-10-CM | POA: Insufficient documentation

## 2020-12-13 ENCOUNTER — Other Ambulatory Visit: Payer: Self-pay

## 2020-12-13 ENCOUNTER — Emergency Department
Admission: EM | Admit: 2020-12-13 | Discharge: 2020-12-13 | Disposition: A | Discharge status: Home / Self Care | Payer: Medicaid Other | Attending: Emergency Medicine | Admitting: Emergency Medicine

## 2020-12-13 ENCOUNTER — Emergency Department: Payer: Medicaid Other

## 2020-12-13 ENCOUNTER — Encounter: Payer: Self-pay | Admitting: Emergency Medicine

## 2020-12-13 DIAGNOSIS — K529 Noninfective gastroenteritis and colitis, unspecified: Secondary | ICD-10-CM | POA: Insufficient documentation

## 2020-12-13 DIAGNOSIS — N390 Urinary tract infection, site not specified: Secondary | ICD-10-CM | POA: Diagnosis not present

## 2020-12-13 DIAGNOSIS — R109 Unspecified abdominal pain: Secondary | ICD-10-CM | POA: Diagnosis present

## 2020-12-13 DIAGNOSIS — Z8616 Personal history of COVID-19: Secondary | ICD-10-CM | POA: Diagnosis not present

## 2020-12-13 DIAGNOSIS — E039 Hypothyroidism, unspecified: Secondary | ICD-10-CM | POA: Diagnosis not present

## 2020-12-13 DIAGNOSIS — Z87891 Personal history of nicotine dependence: Secondary | ICD-10-CM | POA: Diagnosis not present

## 2020-12-13 LAB — LIPASE, BLOOD: Lipase: 43 U/L (ref 11–51)

## 2020-12-13 LAB — COMPREHENSIVE METABOLIC PANEL
ALT: 34 U/L (ref 0–44)
AST: 25 U/L (ref 15–41)
Albumin: 3.8 g/dL (ref 3.5–5.0)
Alkaline Phosphatase: 71 U/L (ref 38–126)
Anion gap: 9 (ref 5–15)
BUN: 9 mg/dL (ref 6–20)
CO2: 27 mmol/L (ref 22–32)
Calcium: 8.9 mg/dL (ref 8.9–10.3)
Chloride: 102 mmol/L (ref 98–111)
Creatinine, Ser: 0.54 mg/dL (ref 0.44–1.00)
GFR, Estimated: >60 mL/min (ref >60)
Glucose, Bld: 99 mg/dL (ref 70–99)
Potassium: 3.8 mmol/L (ref 3.5–5.1)
Sodium: 138 mmol/L (ref 135–145)
Total Bilirubin: 0.5 mg/dL (ref 0.3–1.2)
Total Protein: 7.3 g/dL (ref 6.5–8.1)

## 2020-12-13 LAB — URINALYSIS, COMPLETE (UACMP) WITH MICROSCOPIC
Bacteria, UA: NONE SEEN
Bilirubin Urine: NEGATIVE
Glucose, UA: NEGATIVE mg/dL
Ketones, ur: NEGATIVE mg/dL
Nitrite: NEGATIVE
Protein, ur: NEGATIVE mg/dL
Specific Gravity, Urine: 1.023 (ref 1.005–1.030)
pH: 5 (ref 5.0–8.0)

## 2020-12-13 LAB — CBC
HCT: 37.9 % (ref 36.0–46.0)
Hemoglobin: 13.3 g/dL (ref 12.0–15.0)
MCH: 32.3 pg (ref 26.0–34.0)
MCHC: 35.1 g/dL (ref 30.0–36.0)
MCV: 92 fL (ref 80.0–100.0)
Platelets: 256 10*3/uL (ref 150–400)
RBC: 4.12 MIL/uL (ref 3.87–5.11)
RDW: 12.7 % (ref 11.5–15.5)
WBC: 12.7 10*3/uL — ABNORMAL HIGH (ref 4.0–10.5)
nRBC: 0 % (ref 0.0–0.2)

## 2020-12-13 LAB — POC URINE PREG, ED: Preg Test, Ur: NEGATIVE

## 2020-12-13 MED ORDER — IOHEXOL 350 MG/ML SOLN
80.0000 mL | Freq: Once | INTRAVENOUS | Status: AC | PRN
  Administered 2020-12-13: 80 mL via INTRAVENOUS

## 2020-12-13 MED ORDER — SODIUM CHLORIDE 0.9 % IV BOLUS
1000.0000 mL | Freq: Once | INTRAVENOUS | Status: AC
  Administered 2020-12-13: 1000 mL via INTRAVENOUS

## 2020-12-13 MED ORDER — FENTANYL CITRATE PF 50 MCG/ML IJ SOSY
50.0000 ug | PREFILLED_SYRINGE | Freq: Once | INTRAMUSCULAR | Status: AC
  Administered 2020-12-13: 50 ug via INTRAVENOUS
  Filled 2020-12-13: qty 1

## 2020-12-13 MED ORDER — NITROFURANTOIN MONOHYD MACRO 100 MG PO CAPS: 100.0000 mg | ORAL_CAPSULE | Freq: Two times a day (BID) | ORAL | 0 refills | Status: AC

## 2020-12-13 MED ORDER — DICYCLOMINE HCL 10 MG PO CAPS: 10.0000 mg | ORAL_CAPSULE | Freq: Three times a day (TID) | ORAL | 0 refills | Status: AC | PRN
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
## 2020-12-13 NOTE — ED Notes (Signed)
Patient stable and discharged with all personal belongings and AVS. AVS and discharge instructions reviewed with patient and opportunity for questions provided.   

## 2020-12-13 NOTE — ED Triage Notes (Signed)
Pt to ED via POV, pt states that she has been having lower abdominal pain for the past few days. Pt states that she has also had nausea and some diarrhea. Pt states she has had a few episodes of passing a small amount of blood. Pt states that she has noticed increased fatigue as well. Pt is in NAD.

## 2020-12-13 NOTE — Discharge Instructions (Addendum)
Please seek medical attention for any high fevers, chest pain, shortness of breath, change in behavior, persistent vomiting, bloody stool or any other new or concerning symptoms.  

## 2021-02-22 ENCOUNTER — Emergency Department
Admission: EM | Admit: 2021-02-22 | Discharge: 2021-02-22 | Disposition: A | Discharge status: Home / Self Care | Payer: Medicaid Other | Attending: Emergency Medicine | Admitting: Emergency Medicine

## 2021-02-22 ENCOUNTER — Other Ambulatory Visit: Payer: Self-pay

## 2021-02-22 DIAGNOSIS — E039 Hypothyroidism, unspecified: Secondary | ICD-10-CM | POA: Insufficient documentation

## 2021-02-22 DIAGNOSIS — S0993XA Unspecified injury of face, initial encounter: Secondary | ICD-10-CM | POA: Diagnosis not present

## 2021-02-22 DIAGNOSIS — K029 Dental caries, unspecified: Secondary | ICD-10-CM | POA: Diagnosis not present

## 2021-02-22 DIAGNOSIS — Z8616 Personal history of COVID-19: Secondary | ICD-10-CM | POA: Insufficient documentation

## 2021-02-22 DIAGNOSIS — Z87891 Personal history of nicotine dependence: Secondary | ICD-10-CM | POA: Diagnosis not present

## 2021-02-22 DIAGNOSIS — X58XXXA Exposure to other specified factors, initial encounter: Secondary | ICD-10-CM | POA: Diagnosis not present

## 2021-02-22 MED ORDER — CHLORHEXIDINE GLUCONATE 0.12 % MT SOLN: 3.0000 mL | Freq: Two times a day (BID) | OROMUCOSAL | 0 refills | Status: AC

## 2021-02-22 MED ORDER — OXYCODONE-ACETAMINOPHEN 5-325 MG PO TABS
2.0000 | ORAL_TABLET | Freq: Once | ORAL | Status: AC
  Administered 2021-02-22: 2 via ORAL
  Filled 2021-02-22: qty 2

## 2021-02-22 MED ORDER — OXYCODONE-ACETAMINOPHEN 5-325 MG PO TABS: 2.0000 | ORAL_TABLET | Freq: Three times a day (TID) | ORAL | 0 refills | Status: DC | PRN

## 2021-02-22 MED ORDER — AMOXICILLIN-POT CLAVULANATE 875-125 MG PO TABS
1.0000 | ORAL_TABLET | Freq: Once | ORAL | Status: AC
  Administered 2021-02-22: 1 via ORAL
  Filled 2021-02-22: qty 1

## 2021-02-22 MED ORDER — MAGIC MOUTHWASH W/LIDOCAINE: 5.0000 mL | Freq: Four times a day (QID) | ORAL | 0 refills | Status: DC | PRN

## 2021-02-22 MED ORDER — AMOXICILLIN-POT CLAVULANATE 875-125 MG PO TABS: 1.0000 | ORAL_TABLET | Freq: Two times a day (BID) | ORAL | 0 refills | Status: AC
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
## 2021-02-22 NOTE — ED Triage Notes (Signed)
Pt presents to ER c/o chipped molar in left lower mouth.  Pt states this happened about an hour or two ago and has been unbearable since then.  Pt appears in pain.

## 2021-02-22 NOTE — Discharge Instructions (Signed)
Please follow-up with a dentist of your choice at the next available opportunity.  Please take the prescribed course of antibiotics unless you are told otherwise by a dental provider.  Take ibuprofen according to label instructions as needed for pain. Take Percocet as prescribed for severe pain. Do not drink alcohol, drive or participate in any other potentially dangerous activities while taking this medication as it may make you sleepy. Do not take this medication with any other sedating medications, either prescription or over-the-counter. If you were prescribed Percocet or Vicodin, do not take these with acetaminophen (Tylenol) as it is already contained within these medications.   This medication is an opiate (or narcotic) pain medication and can be habit forming.  Use it as little as possible to achieve adequate pain control.  Do not use or use it with extreme caution if you have a history of opiate abuse or dependence.  If you are on a pain contract with your primary care doctor or a pain specialist, be sure to let them know you were prescribed this medication today from the [facility] Emergency Department.  This medication is intended for your use only - do not give any to anyone else and keep it in a secure place where nobody else, especially children, have access to it.  It will also cause or worsen constipation, so you may want to consider taking an over-the-counter stool softener while you are taking this medication.

## 2021-07-15 DIAGNOSIS — R220 Localized swelling, mass and lump, head: Secondary | ICD-10-CM | POA: Diagnosis not present

## 2021-07-15 DIAGNOSIS — Z5321 Procedure and treatment not carried out due to patient leaving prior to being seen by health care provider: Secondary | ICD-10-CM | POA: Diagnosis not present

## 2021-07-15 DIAGNOSIS — K0889 Other specified disorders of teeth and supporting structures: Secondary | ICD-10-CM | POA: Diagnosis present

## 2021-07-15 NOTE — ED Triage Notes (Signed)
41 y/o female arrived to the [facility] via POV with a CC dental pain. PT states she has been having a toothache for a couple of months. Pt decided to come in due to increased swelling on the right side of her face and pain radiating to right ear. Pt denies chills, fever or drainage. ?

## 2021-07-16 ENCOUNTER — Emergency Department
Admission: EM | Admit: 2021-07-16 | Discharge: 2021-07-16 | Disposition: A | Discharge status: Home / Self Care | Payer: Medicaid Other | Attending: Emergency Medicine | Admitting: Emergency Medicine

## 2021-07-16 MED ORDER — ACETAMINOPHEN 500 MG PO TABS
1000.0000 mg | ORAL_TABLET | Freq: Once | ORAL | Status: AC
  Administered 2021-07-16: 1000 mg via ORAL
  Filled 2021-07-16: qty 2

## 2021-07-16 NOTE — ED Notes (Signed)
No answer when called several times from lobby 

## 2021-07-17 ENCOUNTER — Encounter: Payer: Self-pay | Admitting: Emergency Medicine

## 2021-07-17 ENCOUNTER — Other Ambulatory Visit: Payer: Self-pay

## 2021-07-17 ENCOUNTER — Emergency Department: Payer: Medicaid Other

## 2021-07-17 ENCOUNTER — Emergency Department
Admission: EM | Admit: 2021-07-17 | Discharge: 2021-07-17 | Disposition: A | Discharge status: Home / Self Care | Payer: Medicaid Other | Attending: Emergency Medicine | Admitting: Emergency Medicine

## 2021-07-17 DIAGNOSIS — Z87891 Personal history of nicotine dependence: Secondary | ICD-10-CM | POA: Insufficient documentation

## 2021-07-17 DIAGNOSIS — E039 Hypothyroidism, unspecified: Secondary | ICD-10-CM | POA: Insufficient documentation

## 2021-07-17 DIAGNOSIS — K047 Periapical abscess without sinus: Secondary | ICD-10-CM | POA: Insufficient documentation

## 2021-07-17 DIAGNOSIS — L03211 Cellulitis of face: Secondary | ICD-10-CM | POA: Diagnosis not present

## 2021-07-17 DIAGNOSIS — R519 Headache, unspecified: Secondary | ICD-10-CM | POA: Diagnosis present

## 2021-07-17 LAB — LACTIC ACID, PLASMA: Lactic Acid, Venous: 1.2 mmol/L (ref 0.5–1.9)

## 2021-07-17 LAB — COMPREHENSIVE METABOLIC PANEL
ALT: 32 U/L (ref 0–44)
AST: 32 U/L (ref 15–41)
Albumin: 3.8 g/dL (ref 3.5–5.0)
Alkaline Phosphatase: 74 U/L (ref 38–126)
Anion gap: 7 (ref 5–15)
BUN: 5 mg/dL — ABNORMAL LOW (ref 6–20)
CO2: 27 mmol/L (ref 22–32)
Calcium: 8.9 mg/dL (ref 8.9–10.3)
Chloride: 101 mmol/L (ref 98–111)
Creatinine, Ser: 0.57 mg/dL (ref 0.44–1.00)
GFR, Estimated: >60 mL/min (ref >60)
Glucose, Bld: 116 mg/dL — ABNORMAL HIGH (ref 70–99)
Potassium: 3.8 mmol/L (ref 3.5–5.1)
Sodium: 135 mmol/L (ref 135–145)
Total Bilirubin: 1.2 mg/dL (ref 0.3–1.2)
Total Protein: 7.5 g/dL (ref 6.5–8.1)

## 2021-07-17 LAB — CBC WITH DIFFERENTIAL/PLATELET
Abs Immature Granulocytes: 0.02 10*3/uL (ref 0.00–0.07)
Basophils Absolute: 0 10*3/uL (ref 0.0–0.1)
Basophils Relative: 0 %
Eosinophils Absolute: 0 10*3/uL (ref 0.0–0.5)
Eosinophils Relative: 0 %
HCT: 40.3 % (ref 36.0–46.0)
Hemoglobin: 13.5 g/dL (ref 12.0–15.0)
Immature Granulocytes: 0 %
Lymphocytes Relative: 7 %
Lymphs Abs: 0.6 10*3/uL — ABNORMAL LOW (ref 0.7–4.0)
MCH: 30.5 pg (ref 26.0–34.0)
MCHC: 33.5 g/dL (ref 30.0–36.0)
MCV: 91 fL (ref 80.0–100.0)
Monocytes Absolute: 0.4 10*3/uL (ref 0.1–1.0)
Monocytes Relative: 5 %
Neutro Abs: 8.2 10*3/uL — ABNORMAL HIGH (ref 1.7–7.7)
Neutrophils Relative %: 88 %
Platelets: 145 10*3/uL — ABNORMAL LOW (ref 150–400)
RBC: 4.43 MIL/uL (ref 3.87–5.11)
RDW: 12.8 % (ref 11.5–15.5)
WBC: 9.3 10*3/uL (ref 4.0–10.5)
nRBC: 0 % (ref 0.0–0.2)

## 2021-07-17 LAB — POC URINE PREG, ED: Preg Test, Ur: NEGATIVE

## 2021-07-17 MED ORDER — ONDANSETRON HCL 4 MG/2ML IJ SOLN
4.0000 mg | Freq: Once | INTRAMUSCULAR | Status: AC
  Administered 2021-07-17: 4 mg via INTRAVENOUS
  Filled 2021-07-17: qty 2

## 2021-07-17 MED ORDER — SODIUM CHLORIDE 0.9 % IV SOLN
1.0000 g | Freq: Once | INTRAVENOUS | Status: AC
  Administered 2021-07-17: 1 g via INTRAVENOUS
  Filled 2021-07-17: qty 10

## 2021-07-17 MED ORDER — MORPHINE SULFATE (PF) 4 MG/ML IV SOLN
4.0000 mg | Freq: Once | INTRAVENOUS | Status: AC
  Administered 2021-07-17: 4 mg via INTRAVENOUS
  Filled 2021-07-17: qty 1

## 2021-07-17 MED ORDER — OXYCODONE HCL 5 MG PO TABS: 5.0000 mg | ORAL_TABLET | Freq: Four times a day (QID) | ORAL | 0 refills | Status: AC | PRN

## 2021-07-17 MED ORDER — IOHEXOL 300 MG/ML  SOLN
75.0000 mL | Freq: Once | INTRAMUSCULAR | Status: AC | PRN
  Administered 2021-07-17: 75 mL via INTRAVENOUS
  Filled 2021-07-17: qty 75

## 2021-07-17 MED ORDER — ACETAMINOPHEN 500 MG PO TABS
1000.0000 mg | ORAL_TABLET | Freq: Once | ORAL | Status: AC
  Administered 2021-07-17: 1000 mg via ORAL
  Filled 2021-07-17: qty 2

## 2021-07-17 MED ORDER — SODIUM CHLORIDE 0.9 % IV BOLUS
1000.0000 mL | Freq: Once | INTRAVENOUS | Status: AC
  Administered 2021-07-17: 1000 mL via INTRAVENOUS

## 2021-07-17 MED ORDER — CLINDAMYCIN HCL 300 MG PO CAPS: 300.0000 mg | ORAL_CAPSULE | Freq: Three times a day (TID) | ORAL | 0 refills | Status: AC

## 2021-07-17 NOTE — ED Provider Notes (Signed)
? ?[facility] ?Provider Note ? ? ? Event Date/Time  ? First MD Initiated Contact with Patient 07/17/21 1015   ?  (approximate) ? ? ?History  ? ?Abscess ? ? ?HPI ? ?Bryauna Rajni Holsworth is a 41 y.o. female   presents to the ED with complaint of right facial pain and swelling.  Patient was seen at the emergency department in [facility] and a prescription for amoxicillin and naproxen was written for her dental abscess.  Patient states she woke this morning with increased swelling.  Patient also has had some fever and chills.  Eating has been difficult.  Patient has history of gestational diabetes and hypothyroidism.  She is a former smoker.  Patient does not have a dentist.  She rates her pain as 10/10 ? ?  ? ? ?Physical Exam  ? ?Triage Vital Signs: ?ED Triage Vitals [07/17/21 1000]  ?Enc Vitals Group  ?   BP 132/84  ?   Pulse Rate 96  ?   Resp 18  ?   Temp (!) 100.4 ?F (38 ?C)  ?   Temp Source Oral  ?   SpO2 98 %  ?   Weight 227 lb (103 kg)  ?   Height 5\' 6"  (1.676 m)  ?   Head Circumference   ?   Peak Flow   ?   Pain Score 10  ?   Pain Loc   ?   Pain Edu?   ?   Excl. in GC?   ? ? ?Most recent vital signs: ?Vitals:  ? 07/17/21 1355 07/17/21 1450  ?BP: 138/87   ?Pulse: 92   ?Resp: 18   ?Temp: (!) 101.3 ?F (38.5 ?C) (!) 100.5 ?F (38.1 ?C)  ?SpO2: 98%   ? ? ? ?General: Awake, no distress.  Obvious right facial edema noted. ?CV:  Good peripheral perfusion.  Heart regular rate and rhythm ?Resp:  Normal effort.  Lungs are clear bilaterally. ?Abd:  No distention.  ?Other:  Mouth is moist.  Patient is unable to open completely enough for a visualization of her posterior molars.  Patient has facial edema extending to the upper lip. ? ? ?ED Results / Procedures / Treatments  ? ?Labs ?(all labs ordered are listed, but only abnormal results are displayed) ?Labs Reviewed  ?CBC WITH DIFFERENTIAL/PLATELET - Abnormal; Notable for the following components:  ?    Result Value  ? Platelets 145 (*)   ? Neutro Abs  8.2 (*)   ? Lymphs Abs 0.6 (*)   ? All other components within normal limits  ?COMPREHENSIVE METABOLIC PANEL - Abnormal; Notable for the following components:  ? Glucose, Bld 116 (*)   ? BUN 5 (*)   ? All other components within normal limits  ?LACTIC ACID, PLASMA  ?LACTIC ACID, PLASMA  ?POC URINE PREG, ED  ? ? ? ?RADIOLOGY ?CT scan with contrast per radiologist shows cellulitis on the right down to the mandibles with most likely the primary source being multiple dental abscesses. ? ? ? ?PROCEDURES: ? ?Critical Care performed:  ? ?Procedures ? ? ?MEDICATIONS ORDERED IN ED: ?Medications  ?sodium chloride 0.9 % bolus 1,000 mL (0 mLs Intravenous Stopped 07/17/21 1356)  ?morphine (PF) 4 MG/ML injection 4 mg (4 mg Intravenous Given 07/17/21 1105)  ?ondansetron (ZOFRAN) injection 4 mg (4 mg Intravenous Given 07/17/21 1058)  ?iohexol (OMNIPAQUE) 300 MG/ML solution 75 mL (75 mLs Intravenous Contrast Given 07/17/21 1147)  ?cefTRIAXone (ROCEPHIN) 1 g in sodium chloride 0.9 %  100 mL IVPB (0 g Intravenous Stopped 07/17/21 1352)  ?morphine (PF) 4 MG/ML injection 4 mg (4 mg Intravenous Given 07/17/21 1248)  ?acetaminophen (TYLENOL) tablet 1,000 mg (1,000 mg Oral Given 07/17/21 1405)  ? ? ? ?IMPRESSION / MDM / ASSESSMENT AND PLAN / ED COURSE  ?I reviewed the triage vital signs and the nursing notes. ? ? ?Differential diagnosis includes, but is not limited to, dental abscess, cellulitis, gingivitis, ? ?41 year old female presents to the ED with complaint of right sided facial swelling and pain.  Patient was seen in the emergency department at [facility] for dental abscess at which time she was prescribed amoxicillin and naproxen.  She states this is not helping and this morning the swelling had increased.  She also presented in triage with a temperature of 100.4.  Lab work was reassuring with WBC being 9.3 and CMP was normal.  Lactic acid was 1.2 pregnancy test negative.  CT scan showed multiple dental abscesses which most likely is the  cause source of her cellulitis to the right side of her face.  While patient was here she was given Rocephin 1 g IV and pain medication.  Prior to discharge I talked to her and her husband about the importance of her following up with a dentist as soon as possible.  A list of dental clinics was given to her.  Also we will change her medication to clindamycin 3 times daily and a prescription for oxycodone 5 mg IR was sent to the pharmacy.  She is aware that she can discontinue taking the amoxicillin at this time.  She is return to the emergency department if any severe worsening of her symptoms.  We discussed soft diet and lots of liquids.  She will continue taking Tylenol if needed for fever. ?  ? ? ?FINAL CLINICAL IMPRESSION(S) / ED DIAGNOSES  ? ?Final diagnoses:  ?Dental abscess  ?Diffuse cellulitis of face  ? ? ? ?Rx / DC Orders  ? ?ED Discharge Orders   ? ?      Ordered  ?  clindamycin (CLEOCIN) 300 MG capsule  3 times daily       ? 07/17/21 1454  ?  oxyCODONE (OXY IR/ROXICODONE) 5 MG immediate release tablet  Every 6 hours PRN       ? 07/17/21 1454  ? ?  ?  ? ?  ? ? ? ?Note:  This document was prepared using Dragon voice recognition software and may include unintentional dictation errors. ?  ?Tommi Rumps, PA-C ?07/17/21 1514 ? ?  ?Concha Se, MD ?07/18/21 1518 ? ?

## 2021-07-17 NOTE — ED Triage Notes (Signed)
Pt here with a dental abscess x2 days. Pt went to [facility] yesterday and was given naproxen and abx for the abscess and woke up today to severe swelling the patient also endorses fever and chills.  ?

## 2021-07-17 NOTE — ED Notes (Addendum)
This RN first encounter with pt prior to discharge. Pt verbalized understanding of discharge instructions, prescriptions, and follow-up care instructions. Pt advised if symptoms worsen to return to ED.  

## 2021-07-17 NOTE — Discharge Instructions (Addendum)
Begin taking the antibiotic that was sent to your pharmacy and discontinue taking the antibiotic that you got when you are in [place].  You may continue taking the naproxen for inflammation however a stronger pain medication was sent to the pharmacy as well.  For right now you are restricted to soft foods such as yogurt, ice cream, mashed potatoes.  Continue taking Tylenol if needed for fever.  You also need to call make an appointment with a dentist as you have multiple dental cavities that we will continue to give you problems. ? ?OPTIONS FOR DENTAL FOLLOW UP CARE ? ?[place] [facility] - Local Safety [facility] Dental Clinics ?[email or web address] ?  ?[facility] [phone]) ? ?[employer] [phone]) ? ?[facility] [phone] ext [phone]) ? ?[facility] [phone]) ? ?[facility] [phone]) ?This clinic caters to the indigent population and is on a lottery system. ?Location: ?[employer] [facility], [employer], [address] [place] ?Clinic Hours: ?Wednesdays from 6pm - 9pm, patients seen by a lottery system. ?For dates, call or go to [email or web address] ?Services: ?Cleanings, fillings and simple extractions. ?Payment Options: ?DENTAL WORK IS FREE OF CHARGE. Bring proof of income or support. ?Best way to get seen: ?Arrive at 5:15 pm - this is a lottery, NOT first come/first serve, so arriving earlier will not increase your chances of being seen. ?  ?  ?[facility] ?[phone] ?Select option 1 for emergencies ?  ?Location: ?[employer] [facility], [employer], [address] [place] ?Clinic Hours: ?No walk-ins accepted - call the day before to schedule an appointment. ?Check in times are 9:30 am and 1:30 pm. ?Services: ?Simple extractions, temporary fillings, pulpectomy/pulp debridement, uncomplicated abscess  drainage. ?Payment Options: ?PAYMENT IS DUE AT THE TIME OF SERVICE.  Fee is usually $100-200, additional surgical procedures (e.g. abscess drainage) may be extra. ?Cash, checks, Visa/MasterCard accepted.  Can file Medicaid if patient is covered for dental - patient should call case worker to check. ?No discount for [facility] patients. ?Best way to get seen: ?MUST call the day before and get onto the schedule. Can usually be seen the next 1-2 days. No walk-ins accepted. ?  ?  ?[facility] ?[phone] ?  ?Location: ?[facility], [address], [place] ?Clinic Hours: ?M, W, Th, F 8am or 1:30pm, Tues 9a or 1:30 - first come/first served. ?Services: ?Simple extractions, temporary fillings, uncomplicated abscess drainage.  You do not need to be an [facility] resident. ?Payment Options: ?PAYMENT IS DUE AT THE TIME OF SERVICE. ?Dental insurance, otherwise sliding scale - bring proof of income or support. ?Depending on income and treatment needed, cost is usually $50-200. ?Best way to get seen: ?Arrive early as it is first come/first served. ?  ?  ?[facility] ?[phone] ?  ?Location: ?[address] ?Clinic Hours: ?Mon-Thu 8a-5p ?Services: ?Most basic dental services including extractions and fillings. ?Payment Options: ?PAYMENT IS DUE AT THE TIME OF SERVICE. ?Sliding scale, up to 50% off - bring proof if income or support. ?Medicaid with dental option accepted. ?Best way to get seen: ?Call to schedule an appointment, can usually be seen within 2 weeks OR they will try to see walk-ins - show up at 8a or 2p (you may have to wait). ?  ?  ?[facility] ?[phone] ?[place] RESIDENTS ONLY ?  ?Location: ?[facility] [employer], [address]  [place], [place] [address] ?Clinic Hours: By appointment only. ?Monday - Thursday 8am-5pm, Friday 8am-12pm ?Services: Cleanings, fillings, extractions. ?Payment  Options: ?PAYMENT IS DUE AT THE TIME OF SERVICE. ?Cash, Visa or MasterCard. Sliding scale - $30 minimum per service. ?Best way to get seen: ?Come in to office, complete packet and make an appointment - need proof of income ?or support monies for each household member and proof of [facility] residence. ?Usually takes about a month to get in. ?  ?  ?[facility] ?[phone] ?  ?Location: ?[address] [facility] ?Clinic Hours: Walk-in Urgent Care Dental Services are offered Monday-Friday mornings only. ?The numbers of emergencies accepted daily is limited to the number of ?providers available. ?Maximum 15 - Mondays, Wednesdays & Thursdays ?Maximum 10 - Tuesdays & Fridays ?Services: ?You do not need to be a [facility] resident to be seen for a dental emergency. ?Emergencies are defined as pain, swelling, abnormal bleeding, or dental trauma. Walkins will receive x-rays if needed. ?NOTE: Dental cleaning is not an emergency. ?Payment Options: ?PAYMENT IS DUE AT THE TIME OF SERVICE. ?Minimum co-pay is $40.00 for uninsured patients. ?Minimum co-pay is $3.00 for Medicaid with dental coverage. ?Dental Insurance is accepted and must be presented at time of visit. ?Medicare does not cover dental. ?Forms of payment: Cash, credit card, checks. ?Best way to get seen: ?If not previously registered with the clinic, walk-in dental registration begins at 7:15 am and is on a first come/first serve basis. ?If previously registered with the clinic, call to make an appointment. ?  ?  ?[facility] ?[phone] ?[place] RESIDENTS ONLY ?  ?Location: ?[address] [place], [place] ?Clinic Hours: ?Mon-Thu 10a-2p ?Services: Extractions only! ?Payment Options: ?FREE (donations accepted) - bring proof of income or support ?Best way to get seen: ?Call and schedule an appointment OR come at 8am on the 1st Monday of every month (except for holidays) when it is first come/first served. ?  ?   ?[facility] ?[phone] ?  ?Location: ?[address] [place] ?Clinic Hours: ?Friday mornings ?Services, Payment Options, Best way to get seen: ?Call for info  ?

## 2021-08-10 IMAGING — US US MFM OB DETAIL+14 WK
2 series · 12 of 28 positions shown · non-contrast
Comparison: none

[Series 1: us mfm ob detail+14 wk · 11 of 51 slices shown (1 of 2)]
[im 3/51]
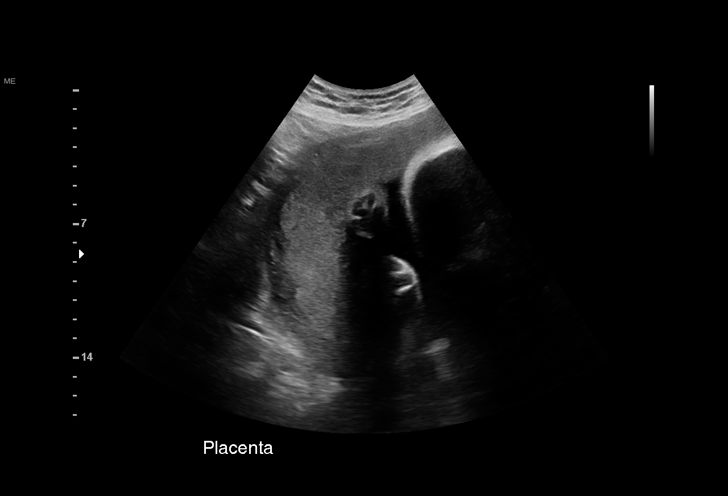
[im 7/51]
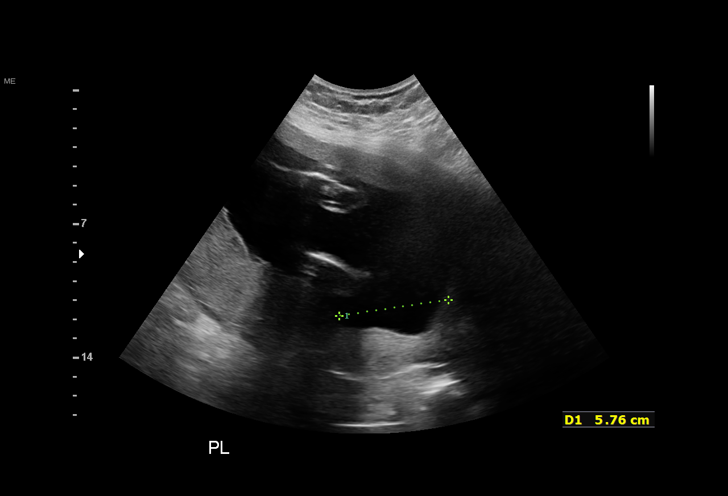
[im 11/51]
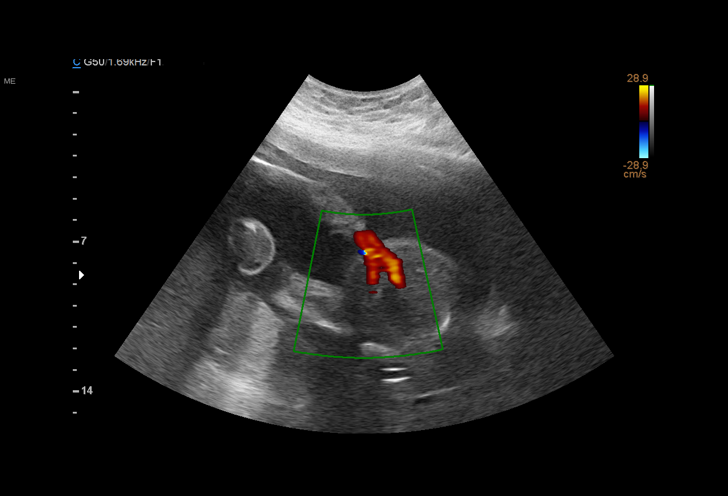
[im 17/51]
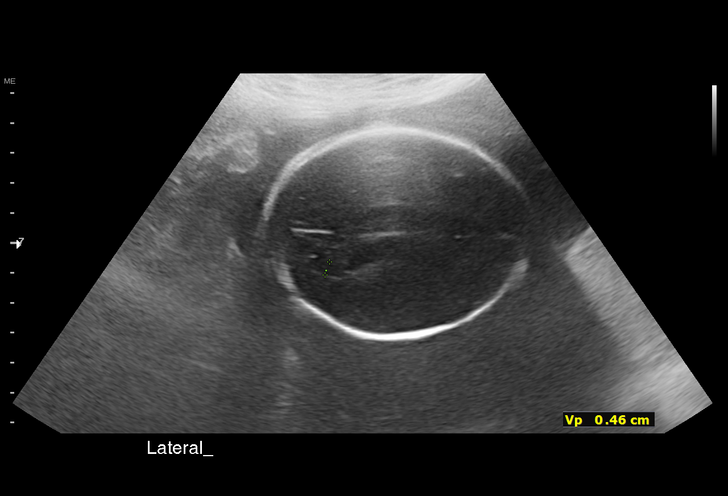
[im 21/51]
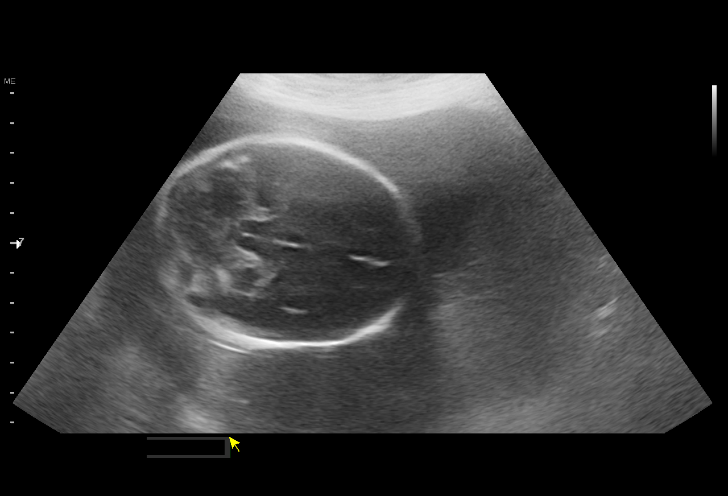
[im 25/51]
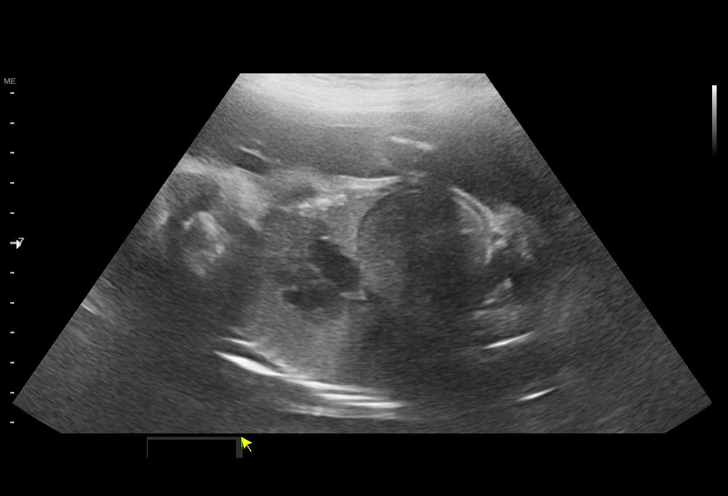
[im 31/51]
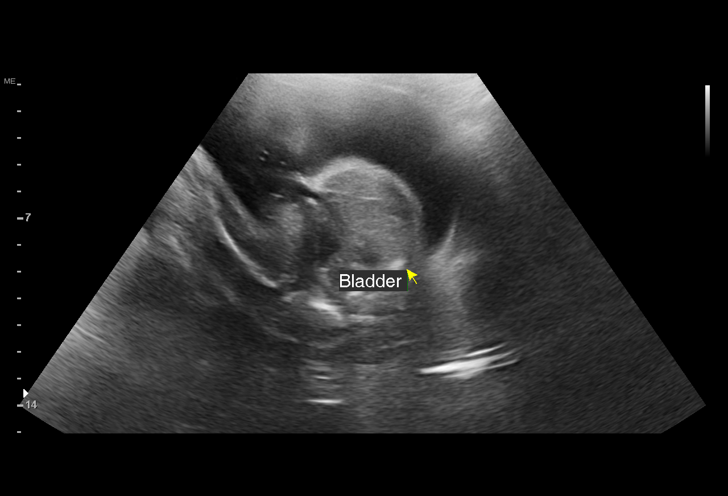
[im 35/51]
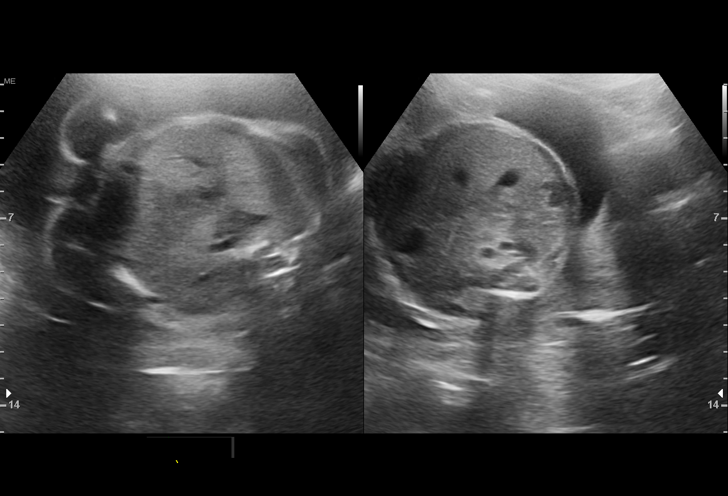
[im 39/51]
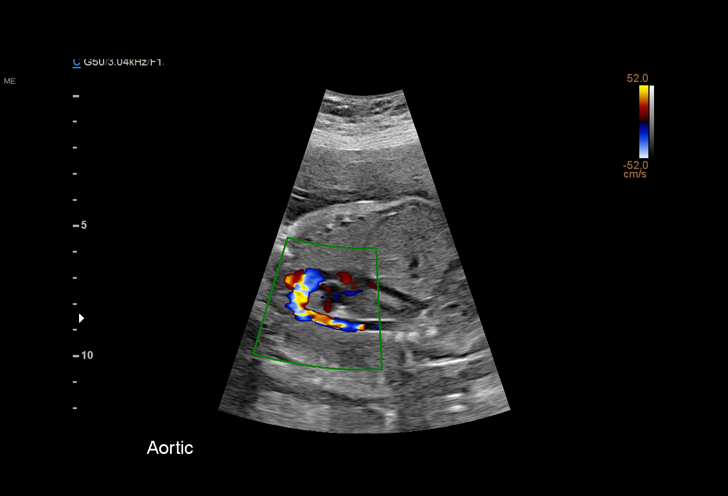
[im 45/51]
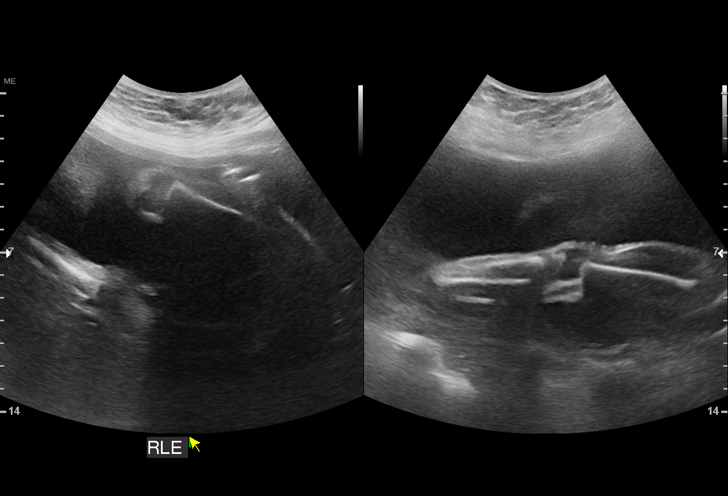
[im 49/51]
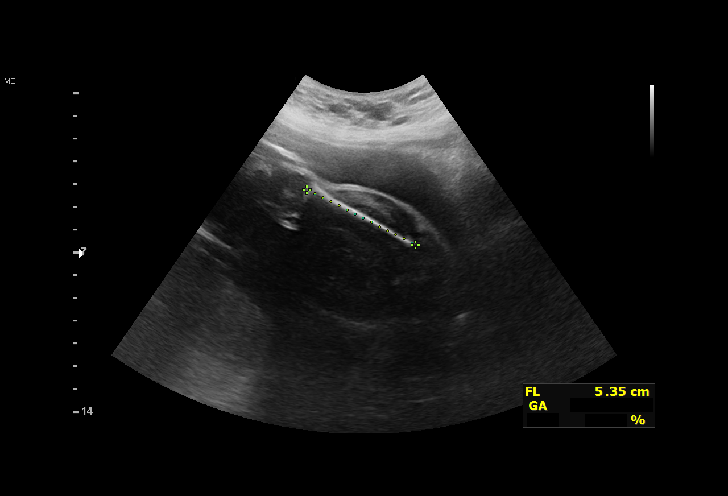

[Series 3: us mfm ob detail+14 wk · 1 of 4 slices shown (2 of 2)]
[im 1/4]
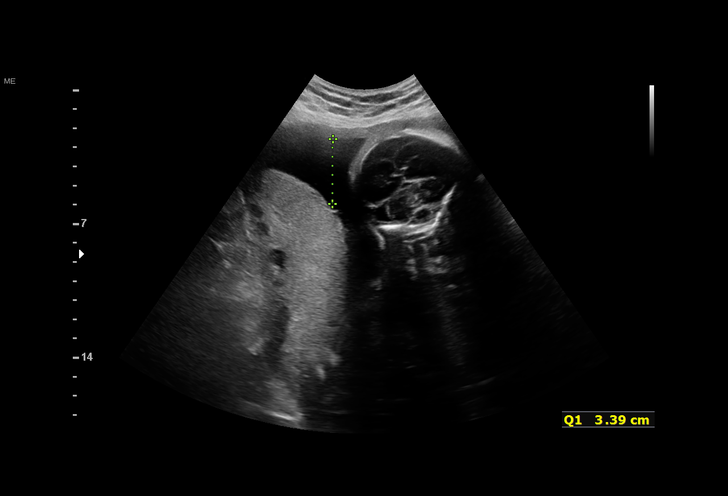

[12 of 28 positions shown; findings below may reference images not displayed]

DELA TORRE

                   RONLOR CNM

Indications

 28 weeks gestation of pregnancy
 Late to prenatal care, third trimester
 Obesity complicating pregnancy, third
 trimester
 Advanced maternal age multigravida 35+,
 third trimester
 Tobacco use complicating pregnancy, third
 trimester
 Thrombocytopenia affecting pregnancy,          O99.119,
 antepartum
 Proteinuria in pregnancy, antepartum
Fetal Evaluation

 Num Of Fetuses:         1
 Fetal Heart Rate(bpm):  144
 Cardiac Activity:       Observed
 Presentation:           Breech
 Placenta:               Fundal
 P. Cord Insertion:      Visualized, central

 AFI Sum(cm)     %Tile       Largest Pocket(cm)
 11.6            24

 RUQ(cm)       RLQ(cm)       LUQ(cm)        LLQ(cm)

Biometry

 BPD:      69.8  mm     G. Age:  28w 0d         30  %    CI:        71.85   %    70 - 86
                                                         FL/HC:      20.6   %    18.8 -
 HC:      262.1  mm     G. Age:  28w 4d         25  %    HC/AC:      1.05        1.05 -
 AC:      249.8  mm     G. Age:  29w 1d         71  %    FL/BPD:     77.2   %    71 - 87
 FL:       53.9  mm     G. Age:  28w 4d         42  %    FL/AC:      21.6   %    20 - 24
 CER:      32.7  mm     G. Age:  27w 6d         40  %

 LV:        4.6  mm
 CM:        3.8  mm

 Est. FW:    5345  gm    2 lb 14 oz      58  %
Gestational Age

 LMP:           25w 6d        Date:  11/08/19                 EDD:   08/14/20
 U/S Today:     28w 4d                                        EDD:   07/26/20
 Best:          28w 2d     Det. By:  Early Ultrasound         EDD:   07/28/20
                                     (04/10/20)
Anatomy

 Cranium:               Appears normal         LVOT:                   Not well visualized
 Cavum:                 Appears normal         Aortic Arch:            Appears normal
 Ventricles:            Appears normal         Ductal Arch:            Not well visualized
 Choroid Plexus:        Appears normal         Diaphragm:              Appears normal
 Cerebellum:            Appears normal         Stomach:                Appears normal, left
                                                                       sided
 Posterior Fossa:       Appears normal         Abdomen:                Appears normal
 Nuchal Fold:           Not applicable (>20    Abdominal Wall:         Not well visualized
                        wks GA)
 Face:                  Appears normal         Cord Vessels:           Appears normal (3
                        (orbits and profile)                           vessel cord)
 Lips:                  Appears normal         Kidneys:                Appear normal
 Palate:                Not well visualized    Bladder:                Appears normal
 Thoracic:              Appears normal         Spine:                  Not well visualized
 Heart:                 Appears normal         Upper Extremities:      Appears normal
                        (4CH, axis, and
                        situs)
 RVOT:                  Not well visualized    Lower Extremities:      Appears normal
Impression

 Single intrauterine pregnancy with measurements consistent
 with dates.
 Normal anatomy however, suboptimal views of the fetal
 anatomy was obtained secondary to fetal position, maternal
 habitus, and advanced gestational age.
 There is good fetal movement and amniotic fluid volume

 Impression/Counseling from Consultation- see [REDACTED] for
 details.
 1) Abnormal TFT's- Ms. Makram has no signs or symptoms of
 overt hyperthyroidism. Her labs are just near normal limits
 with the TSH slightly lower than normal and T3 slightly higher
 but not > 1.5 x normal. Which for T3 this may be a pregnancy
 related increased due elevated circulating TBG. She has
 been referred to an endocrinologist for further evaluation. At
 best she has subclinical hyperthyroidism which is not treated
 in pregnancy. I suggest at this time repeat labs in 1 weeks
 since that were drawn 3 weeks ago.

 2) Gestational thrombocytopenia: Her platelets are < 150
 mm. I explained that this is likely gestational
 thrombocytopenia, verses HELLP syndrome or preeclampsia.
 She does have elevated UPC at 311 but normal blood
 pressure. She is without s/sx of preeclampsia. Repeating
 CBC monthly. If her platelets drop below 70-80 she will not be
 a candidate for regional anesthesia. Consider anesthesia
 appointment in the third trimester and an hematology referral
 if her platelets drop below 70.

 3) BMI >40 We discussed nutritional and exercise habits to
 reach to recommended weight gain between 11-20 lb. She is
 aware and conveyed that she will began making these
 adjustments.

 4) Failed 1hr GTT she is scheduled to take her 3 hr GTT.

 5) Smoking- she plans to stop smoking she is down to 2-3
 cigarettes per day.
Recommendations

 1) Follow up growth at 32 and 36 weeks
 2) Initiate weekly testing at 36 weeks given elevated BMI
 3) Follow up with 3hr GTT given elevated 1hr GTT.
 4) Repeat TFT's in 1 weeks, follow up on endocrine referral
 and recommendations
 5) Monthly CBC, if Platelets drop below 90 consult MFM and
 consult hematology.
 6)  Anesthesia consultation at 32-34 weeks.

## 2022-10-20 IMAGING — CT CT MAXILLOFACIAL W/ CM
3 of 6 series · 15 of 47 positions shown, 18 images · IV contrast (APPLIED)
Comparison: None Available.

CLINICAL DATA: Dental abscess.

EXAM:
CT MAXILLOFACIAL WITH CONTRAST
TECHNIQUE: Multidetector CT imaging of the maxillofacial structures was
performed with intravenous contrast. Multiplanar CT image
reconstructions were also generated.

[Series 4: maxilllofacial 2.0 hr40 3 · axial · 0.40mm/px · z∈[-232,-64]mm · 10 of 100 slices shown, 13 images]
[im 8/100  brain]
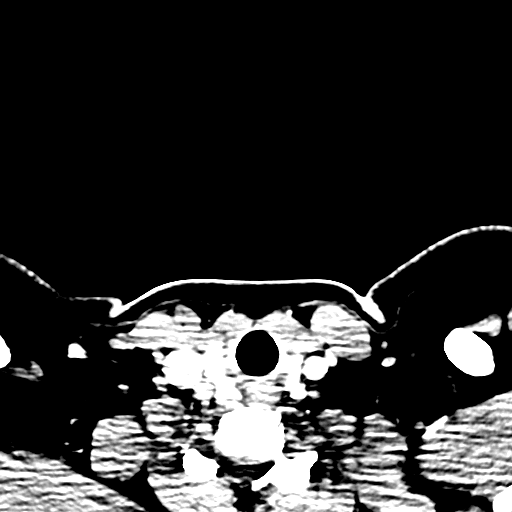
[im 8/100  bone]
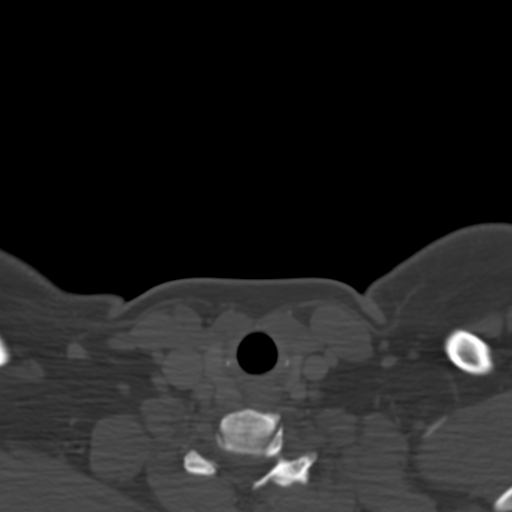
[im 15/100  bone]
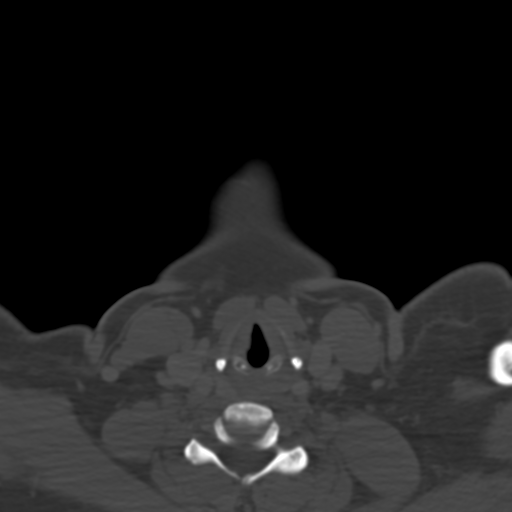
[im 29/100  bone]
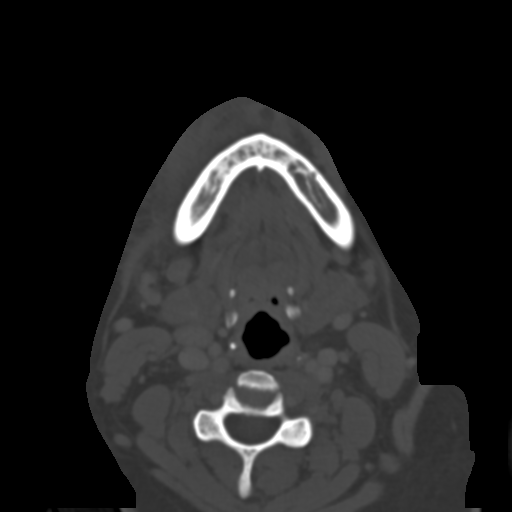
[im 36/100  bone]
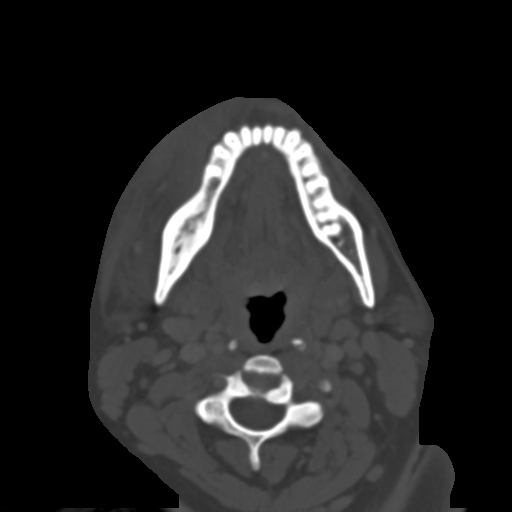
[im 43/100  brain]
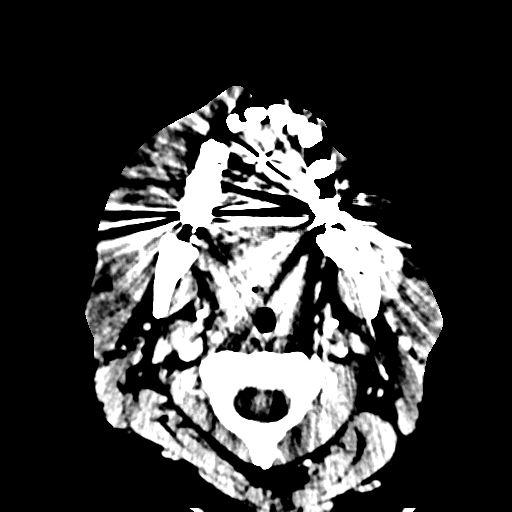
[im 43/100  bone]
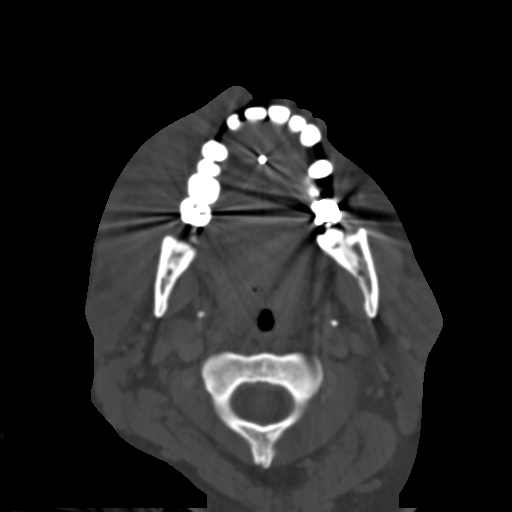
[im 57/100  bone]
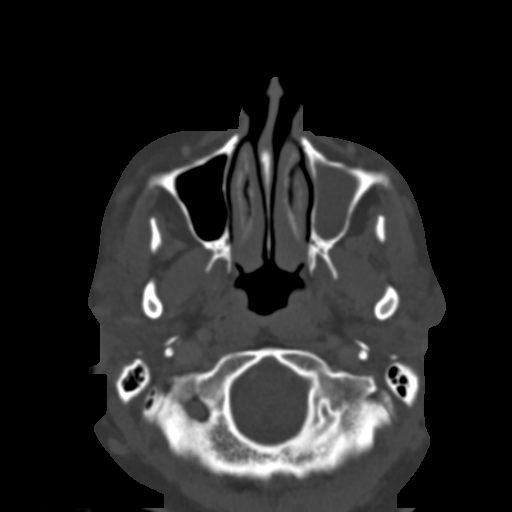
[im 64/100  bone]
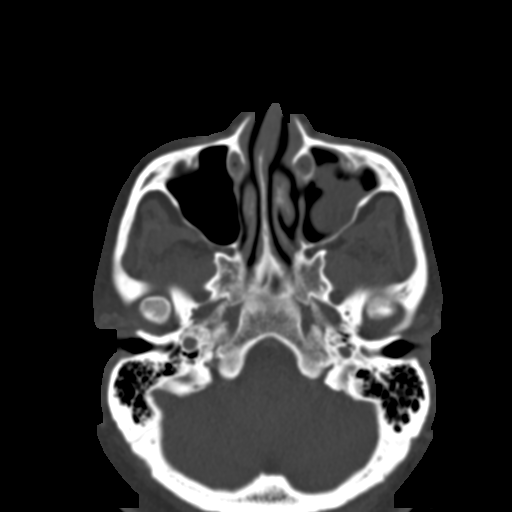
[im 71/100  bone]
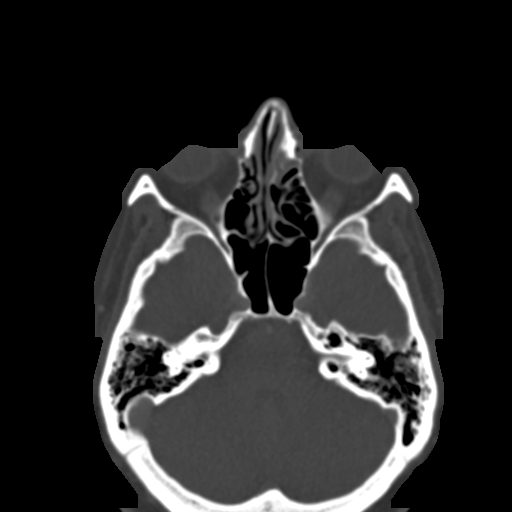
[im 85/100  brain]
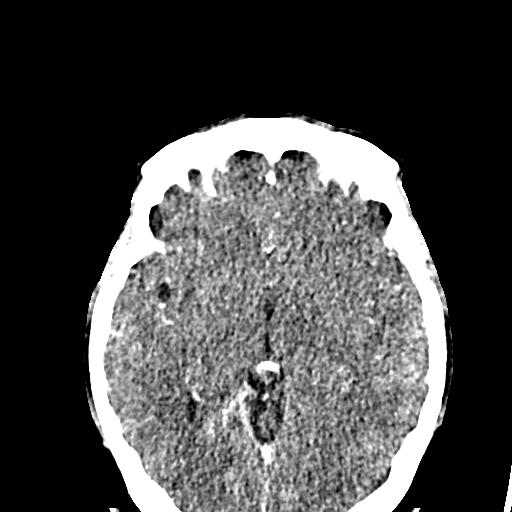
[im 85/100  bone]
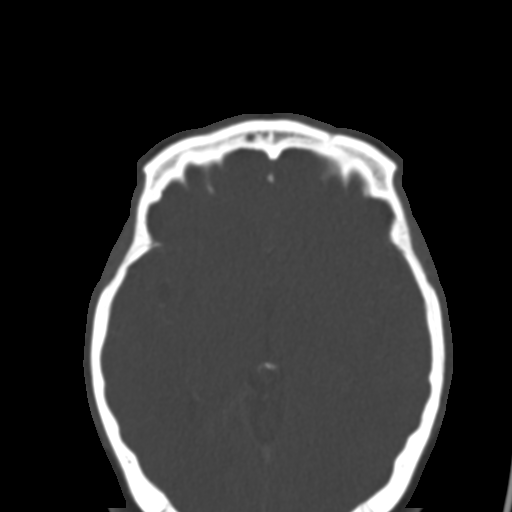
[im 92/100  bone]
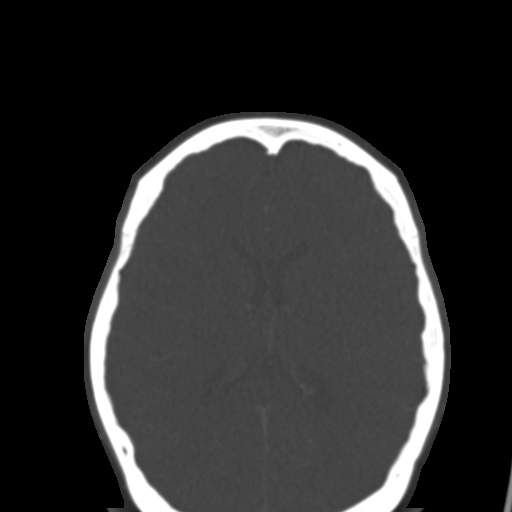

[Series 8: st cor · coronal · 0.43mm/px · 3 of 95 slices shown]
[im 24/95  bone]
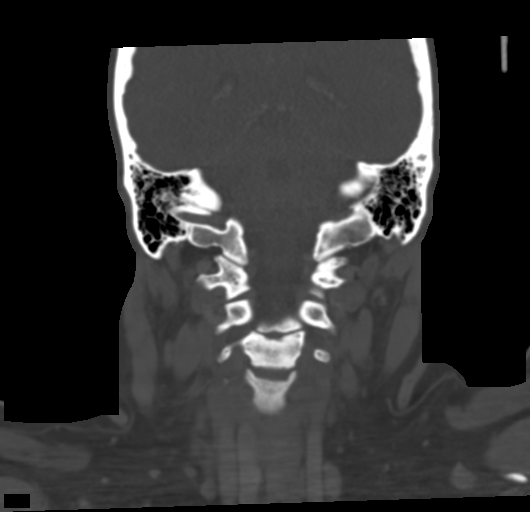
[im 48/95  bone]
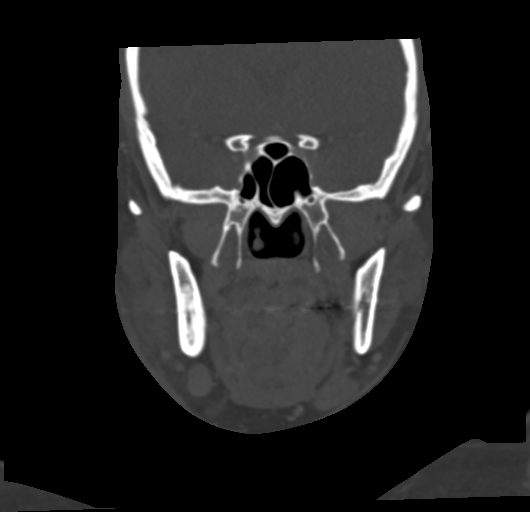
[im 71/95  bone]
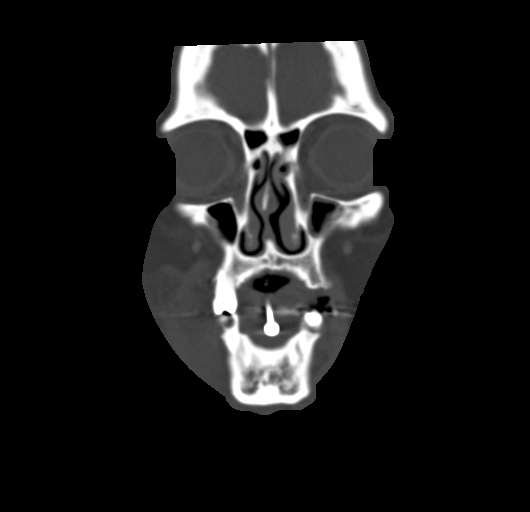

[Series 11: bone sag · sagittal · 0.39mm/px · 2 of 95 slices shown]
[im 32/95  bone]
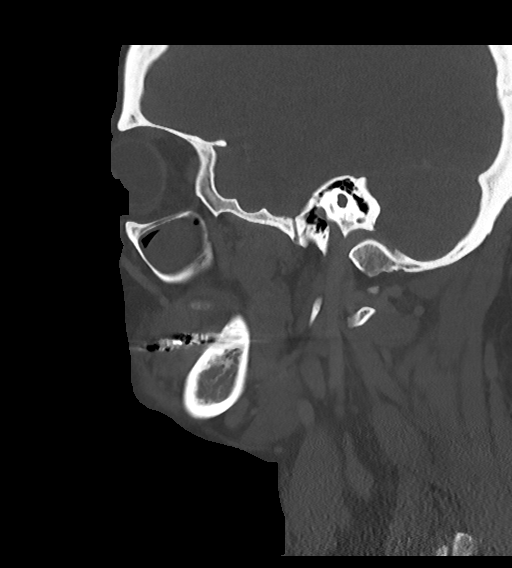
[im 63/95  bone]
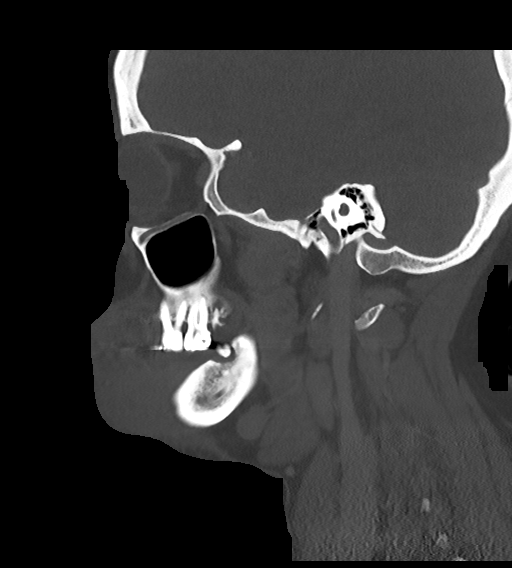

[15 of 47 positions shown; findings below may reference images not displayed]

RADIATION DOSE REDUCTION: This exam was performed according to the
departmental dose-optimization program which includes automated
exposure control, adjustment of the mA and/or kV according to
patient size and/or use of iterative reconstruction technique.

CONTRAST:  75mL OMNIPAQUE IOHEXOL 300 MG/ML  SOLN
FINDINGS: Osseous/dental: There are numerous dental caries involving maxillary
and mandibular teeth. There is prominent periapical lucency around
the posterior most right mandibular molar. There is no evidence of
destruction of the underlying cortex common but there is diffusely
sclerotic appearance of the surrounding mandible suggesting chronic
infection.

Orbits: The globes and orbits are unremarkable.

Sinuses: There is partial opacification of the left maxillary sinus
with a probable mucous retention cyst. There is mild mucosal
thickening in the left frontal sinus.

Soft tissues: There is skin thickening with inflammatory fat
stranding throughout the soft tissues of the right face with
asymmetric thickening of the platysma. There is no organized or
drainable fluid collection. Prominent lymph nodes measuring up to
1.0 cm at level IIA are likely reactive.

Limited intracranial: The imaged portions of the intracranial
compartment are unremarkable.
IMPRESSION: 1. Inflammatory change throughout the right face over the mandible
likely reflecting cellulitis which may be odontogenic in nature. No
organized or drainable fluid collection is identified.
2. Sclerotic appearance of the right hemimandible centered around
the root of the posterior most molar raises suspicion for chronic
infection.
